# Patient Record
Sex: Female | Born: 1970 | Hispanic: No | Marital: Married | Smoking: Never smoker
Health system: Southern US, Community
[De-identification: ages and names within clinical notes are randomized; demographics above are authoritative.]

## PROBLEM LIST (undated history)

## (undated) DIAGNOSIS — I340 Nonrheumatic mitral (valve) insufficiency: Secondary | ICD-10-CM

## (undated) DIAGNOSIS — F419 Anxiety disorder, unspecified: Secondary | ICD-10-CM

## (undated) DIAGNOSIS — F329 Major depressive disorder, single episode, unspecified: Secondary | ICD-10-CM

## (undated) DIAGNOSIS — F32A Depression, unspecified: Secondary | ICD-10-CM

## (undated) DIAGNOSIS — I050 Rheumatic mitral stenosis: Secondary | ICD-10-CM

## (undated) DIAGNOSIS — N390 Urinary tract infection, site not specified: Secondary | ICD-10-CM

## (undated) HISTORY — DX: Major depressive disorder, single episode, unspecified: F32.9

## (undated) HISTORY — DX: Gilbert syndrome: E80.4

## (undated) HISTORY — DX: Depression, unspecified: F32.A

## (undated) HISTORY — PX: UTERINE FIBROID SURGERY: SHX826

## (undated) HISTORY — DX: Rheumatic mitral stenosis: I05.0

## (undated) HISTORY — PX: TONSILLECTOMY: SUR1361

## (undated) HISTORY — PX: APPENDECTOMY: SHX54

---

## 1997-12-02 ENCOUNTER — Emergency Department (HOSPITAL_COMMUNITY): Admission: EM | Admit: 1997-12-02 | Discharge: 1997-12-02 | Payer: Self-pay | Admitting: Emergency Medicine

## 1998-04-10 ENCOUNTER — Emergency Department (HOSPITAL_COMMUNITY): Admission: EM | Admit: 1998-04-10 | Discharge: 1998-04-10 | Payer: Self-pay | Admitting: Emergency Medicine

## 1998-06-04 ENCOUNTER — Emergency Department (HOSPITAL_COMMUNITY): Admission: EM | Admit: 1998-06-04 | Discharge: 1998-06-04 | Payer: Self-pay | Admitting: Emergency Medicine

## 1998-09-09 ENCOUNTER — Other Ambulatory Visit: Admission: RE | Admit: 1998-09-09 | Discharge: 1998-09-09 | Payer: Self-pay | Admitting: Obstetrics

## 2000-01-08 ENCOUNTER — Emergency Department (HOSPITAL_COMMUNITY): Admission: EM | Admit: 2000-01-08 | Discharge: 2000-01-08 | Payer: Self-pay | Admitting: Emergency Medicine

## 2000-07-04 ENCOUNTER — Other Ambulatory Visit: Admission: RE | Admit: 2000-07-04 | Discharge: 2000-07-04 | Payer: Self-pay | Admitting: Obstetrics

## 2000-09-02 ENCOUNTER — Inpatient Hospital Stay (HOSPITAL_COMMUNITY): Admission: AD | Admit: 2000-09-02 | Discharge: 2000-09-02 | Payer: Self-pay | Admitting: Obstetrics

## 2000-09-02 ENCOUNTER — Encounter: Payer: Self-pay | Admitting: Obstetrics

## 2000-09-23 ENCOUNTER — Inpatient Hospital Stay (HOSPITAL_COMMUNITY): Admission: AD | Admit: 2000-09-23 | Discharge: 2000-09-23 | Payer: Self-pay | Admitting: Obstetrics

## 2001-02-25 ENCOUNTER — Encounter: Payer: Self-pay | Admitting: Obstetrics

## 2001-02-25 ENCOUNTER — Ambulatory Visit (HOSPITAL_COMMUNITY): Admission: RE | Admit: 2001-02-25 | Discharge: 2001-02-25 | Payer: Self-pay | Admitting: Obstetrics

## 2001-02-26 ENCOUNTER — Inpatient Hospital Stay (HOSPITAL_COMMUNITY): Admission: AD | Admit: 2001-02-26 | Discharge: 2001-02-26 | Payer: Self-pay | Admitting: Obstetrics

## 2001-03-22 ENCOUNTER — Observation Stay (HOSPITAL_COMMUNITY): Admission: AD | Admit: 2001-03-22 | Discharge: 2001-03-23 | Payer: Self-pay | Admitting: Obstetrics

## 2001-03-28 ENCOUNTER — Inpatient Hospital Stay (HOSPITAL_COMMUNITY): Admission: AD | Admit: 2001-03-28 | Discharge: 2001-03-28 | Payer: Self-pay | Admitting: Obstetrics

## 2001-04-06 ENCOUNTER — Inpatient Hospital Stay (HOSPITAL_COMMUNITY): Admission: AD | Admit: 2001-04-06 | Discharge: 2001-04-06 | Payer: Self-pay | Admitting: Obstetrics

## 2001-04-11 ENCOUNTER — Encounter (INDEPENDENT_AMBULATORY_CARE_PROVIDER_SITE_OTHER): Payer: Self-pay | Admitting: *Deleted

## 2001-04-11 ENCOUNTER — Inpatient Hospital Stay (HOSPITAL_COMMUNITY): Admission: AD | Admit: 2001-04-11 | Discharge: 2001-04-14 | Payer: Self-pay | Admitting: Obstetrics

## 2002-05-05 ENCOUNTER — Encounter: Admission: RE | Admit: 2002-05-05 | Discharge: 2002-05-05 | Payer: Self-pay | Admitting: Internal Medicine

## 2002-08-11 ENCOUNTER — Inpatient Hospital Stay (HOSPITAL_COMMUNITY): Admission: AD | Admit: 2002-08-11 | Discharge: 2002-08-11 | Payer: Self-pay | Admitting: Obstetrics

## 2002-08-11 ENCOUNTER — Encounter: Payer: Self-pay | Admitting: Obstetrics and Gynecology

## 2003-02-05 ENCOUNTER — Inpatient Hospital Stay (HOSPITAL_COMMUNITY): Admission: RE | Admit: 2003-02-05 | Discharge: 2003-02-08 | Payer: Self-pay | Admitting: Obstetrics & Gynecology

## 2003-06-04 ENCOUNTER — Encounter: Admission: RE | Admit: 2003-06-04 | Discharge: 2003-06-04 | Payer: Self-pay | Admitting: Internal Medicine

## 2003-06-12 ENCOUNTER — Encounter: Admission: RE | Admit: 2003-06-12 | Discharge: 2003-06-12 | Payer: Self-pay | Admitting: Internal Medicine

## 2003-09-13 ENCOUNTER — Emergency Department (HOSPITAL_COMMUNITY): Admission: AD | Admit: 2003-09-13 | Discharge: 2003-09-13 | Payer: Self-pay | Admitting: Family Medicine

## 2003-09-15 ENCOUNTER — Emergency Department (HOSPITAL_COMMUNITY): Admission: EM | Admit: 2003-09-15 | Discharge: 2003-09-15 | Payer: Self-pay | Admitting: Emergency Medicine

## 2003-11-24 ENCOUNTER — Encounter: Admission: RE | Admit: 2003-11-24 | Discharge: 2003-11-24 | Payer: Self-pay | Admitting: Internal Medicine

## 2003-11-25 ENCOUNTER — Encounter: Admission: RE | Admit: 2003-11-25 | Discharge: 2003-11-25 | Payer: Self-pay | Admitting: Internal Medicine

## 2004-01-13 ENCOUNTER — Encounter: Admission: RE | Admit: 2004-01-13 | Discharge: 2004-01-13 | Payer: Self-pay | Admitting: Internal Medicine

## 2006-06-22 ENCOUNTER — Other Ambulatory Visit: Admission: RE | Admit: 2006-06-22 | Discharge: 2006-06-22 | Payer: Self-pay | Admitting: Internal Medicine

## 2006-07-05 ENCOUNTER — Ambulatory Visit: Payer: Self-pay | Admitting: Internal Medicine

## 2006-07-05 LAB — CONVERTED CEMR LAB
ALT: 17 units/L (ref 0–40)
AST: 18 units/L (ref 0–37)
Albumin: 4.2 g/dL (ref 3.5–5.2)
Alkaline Phosphatase: 43 units/L (ref 39–117)
Bilirubin, Direct: 0.4 mg/dL — ABNORMAL HIGH (ref 0.0–0.3)
Ceruloplasmin: 21 mg/dL (ref 21–63)
Total Bilirubin: 2.7 mg/dL — ABNORMAL HIGH (ref 0.3–1.2)
Total Protein: 6.6 g/dL (ref 6.0–8.3)

## 2006-07-18 ENCOUNTER — Encounter: Payer: Self-pay | Admitting: Internal Medicine

## 2006-07-18 LAB — CONVERTED CEMR LAB: LDH: 162 units/L (ref 94–250)

## 2008-07-01 ENCOUNTER — Telehealth (INDEPENDENT_AMBULATORY_CARE_PROVIDER_SITE_OTHER): Payer: Self-pay | Admitting: *Deleted

## 2008-07-12 ENCOUNTER — Emergency Department (HOSPITAL_COMMUNITY): Admission: EM | Admit: 2008-07-12 | Discharge: 2008-07-12 | Payer: Self-pay | Admitting: Emergency Medicine

## 2008-07-17 ENCOUNTER — Ambulatory Visit: Payer: Self-pay | Admitting: Internal Medicine

## 2009-01-19 ENCOUNTER — Encounter: Admission: RE | Admit: 2009-01-19 | Discharge: 2009-01-19 | Payer: Self-pay | Admitting: Internal Medicine

## 2009-01-19 ENCOUNTER — Ambulatory Visit: Payer: Self-pay | Admitting: Internal Medicine

## 2009-02-18 ENCOUNTER — Ambulatory Visit: Payer: Self-pay | Admitting: Internal Medicine

## 2009-10-25 ENCOUNTER — Emergency Department (HOSPITAL_COMMUNITY): Admission: EM | Admit: 2009-10-25 | Discharge: 2009-10-25 | Payer: Self-pay | Admitting: Family Medicine

## 2010-01-27 ENCOUNTER — Ambulatory Visit: Payer: Self-pay | Admitting: Internal Medicine

## 2010-02-25 ENCOUNTER — Ambulatory Visit: Payer: Self-pay | Admitting: Internal Medicine

## 2010-02-25 ENCOUNTER — Other Ambulatory Visit: Admission: RE | Admit: 2010-02-25 | Discharge: 2010-02-25 | Payer: Self-pay | Admitting: Internal Medicine

## 2010-04-03 ENCOUNTER — Emergency Department (HOSPITAL_COMMUNITY)
Admission: EM | Admit: 2010-04-03 | Discharge: 2010-04-03 | Payer: Self-pay | Source: Home / Self Care | Admitting: Emergency Medicine

## 2010-06-28 NOTE — Progress Notes (Signed)
  Phone Note Call from Patient   Summary of Call: Patient presented complaining mid upper back pain with pain in left shoulder and elbow.  She denies weakness, numbness or radicular pain.  Pain started about 10 days ago.  It is intermittent, burning, made worse by certain movements.  No trauma, but she has a young child she carries in that arm and lays on it.  Will give trial of Flexeril, heating pad, and Ibuprofen.  If no improvement in one to two weeks, she will need formal evaluation.  Pt was counseled on side effects of Flexeril.  Initial call taken by: Manning Charity MD,  July 01, 2008 4:47 PM    New/Updated Medications: FLEXERIL 5 MG TABS (CYCLOBENZAPRINE HCL) Take 1 tablet by mouth every eight hours as needed for back pain.   Prescriptions: FLEXERIL 5 MG TABS (CYCLOBENZAPRINE HCL) Take 1 tablet by mouth every eight hours as needed for back pain.  #21 x 1   Entered and Authorized by:   Manning Charity MD   Signed by:   Manning Charity MD on 07/01/2008   Method used:   Print then Give to Patient   RxID:   [id number]

## 2010-08-15 LAB — WOUND CULTURE: Culture: NO GROWTH

## 2010-10-14 NOTE — H&P (Signed)
[facility]  Patient:    Marissa Lewis, Marissa Lewis Visit Number: [id number] MRN: [id number]          Service Type: OBS Location: 910A 9147 01 Attending Physician:  Venita Sheffield Dictated by:   Kathreen Cosier, M.D. Admit Date:  04/11/2001                           History and Physical  NO DICTATION (ALREADY ON CHART) Dictated by:   Kathreen Cosier, M.D. Attending Physician:  Venita Sheffield DD:  04/11/01 TD:  04/11/01 Job: [id number]

## 2010-10-14 NOTE — Assessment & Plan Note (Signed)
[facility]                         GASTROENTEROLOGY OFFICE NOTE   NAME:SCOTTLataria, Courser                      MRN:          [id number]  DATE:07/05/2006                            DOB:          Sep 10, 1970    CHIEF COMPLAINT:  Abnormal liver tests.   ASSESSMENT:  A 40 year old African American woman I was asked to see by  Dr. Lenord Fellers regarding elevated bilirubin. I had seen her back in 2005,  with a cholestatic jaundice problem with abnormal transaminases,  bilirubin and alkaline phosphatase. Subsequently, she has had repeat  LFTs, which showed elevated bilirubin mostly which is indirect. 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 Her transaminases and alkaline phosphatase and GGT  are normal now.   RECOMMENDATIONS AND PLAN:  1. Check ceruloplasmin and repeat a hepatic function panel one more      time, check an LDH. She is not anemic, so I do not think this is      hemolysis. That is in the differential, but seems unlikely overall.  2. Stop supplementing with herbal weight gain supplement and Megace      (she got a Megace prescription from someone else). She has normal      body weight and body mass index. She has tried to gain weight for      some reason, but I have recommended to her that she does not need      to do that for any health purposes. She can stop her iron and take      a multivitamin if she wants.  3. Further plans pending on the above. I will call with results.   HISTORY:  A  40 year old [place] American woman with history as above.  Recent laboratory testing from Dr. Lenord Fellers has shown normal CBC, normal  BMET. Her lipid profile is normal. She had a bilirubin total of 3.7 with  direct 0.6, indirect 3.1; normal alk-phos, AST/ALT in 2007. The other  labs were from 2008. She also had LFTs last month, January 2008, with  bilirubin of 3.4, direct 0.5, indirect 2.9.  Her earlier LFTs  were  normal as was her TSH.   She has no symptoms. She does not notice any jaundice, perhaps some  fatigue at times. There is no family history of liver disease. She does  not drink alcohol.   PAST MEDICAL HISTORY:  1. Cholestatic jaundice thought secondary to birth control pills in      the past. That seems to have resolved.  2. Appendectomy.  3. Cesarean section x1. Natural childbirth x1.  4. Lysis of adhesions with her appendectomy.   CURRENT MEDICATIONS:  1. Iron.  2. Multivitamin.   DRUG ALLERGIES:  PENICILLIN.   SOCIAL HISTORY:  She is a patient Development worker, international aid at [employer], two sons and lives with her husband and children. No alcohol,  tobacco or drugs.   REVIEW OF SYSTEMS:  Otherwise, negative.   PHYSICAL EXAMINATION:  Reveals a well-developed, well-nourished young  black woman. Height is 5 feet, 8 inches; 139 pounds. Blood pressure  110/56. Pulse 60.  EYES: Anicteric, though the sclerae might be slightly muddy.  NECK: Is supple without mass, thyromegaly. No lymphadenopathy in the  neck or supraclavicular nodes.  CHEST: Is clear.  HEART: S1, S2. No murmurs, rubs or gallop.  ABDOMEN: Is soft. Nontender. There is no organomegaly or mass  specifically. No hepatosplenomegaly.  EXTREMITIES: No edema.  She is alert and oriented x3.   I appreciate the opportunity to care for this patient.     Iva Boop, MD,FACG  Electronically Signed    CEG/MedQ  DD: 07/05/2006  DT: 07/05/2006  Job #: [id number]   cc:   Luanna Cole. Lenord Fellers, M.D.

## 2010-10-14 NOTE — Discharge Summary (Signed)
   NAME:  Marissa Lewis, Marissa Lewis                         ACCOUNT NO.:  [id number]   MEDICAL RECORD NO.:  [id number]                   PATIENT TYPE:  INP   LOCATION:  9129                                 FACILITY:  [facility]   PHYSICIAN:  Charles A. Clearance Coots, M.D.             DATE OF BIRTH:  September 23, 1970   DATE OF ADMISSION:  02/05/2003  DATE OF DISCHARGE:  02/08/2003                                 DISCHARGE SUMMARY   ADMITTING DIAGNOSES:  Symptomatic uterine fibroids.   DISCHARGE DIAGNOSES:  Symptomatic uterine fibroids status post abdominal  myomectomy.   DISPOSITION:  Discharged home in good condition.   REASON FOR ADMISSION:  A 40 year old African-American female with  symptomatic uterine fibroids presents for abdominal myomectomy.   PAST SURGICAL HISTORY:  1. Breast biopsy.  2. Cesarean section.   PAST MEDICAL HISTORY:  None.   MEDICATIONS:  Iron.   ALLERGIES:  PENICILLIN.   FAMILY HISTORY:  Positive for diabetes.   PHYSICAL EXAMINATION:  GENERAL:  Well-nourished, well-developed black female  in no acute distress.  VITAL SIGNS:  Temperature 98.9, pulse 72, blood pressure 100/70.  HEENT:  Normal.  LUNGS:  Clear to auscultation bilaterally.  HEART:  Regular rate and rhythm.  ABDOMEN:  Tender mass in the left lower quadrant.  EXTREMITIES:  Without clubbing, cyanosis, edema.  PELVIC:  Uterus mildly enlarged, tender.   ADMITTING LABORATORY VALUES:  Hemoglobin 11.6, hematocrit 34.0, white blood  cell count 7300, platelets 329,000.   HOSPITAL COURSE:  The patient underwent an abdominal myomectomy on February 05, 2003.  There were no intraoperative complications.  Postoperative course  was uncomplicated and she was discharged home on postoperative day #38 in  good condition.   DISCHARGE LABORATORIES:  Hemoglobin 7, hematocrit 19.8, white blood cell  count 13,500, platelets 268,000.    DISCHARGE DISPOSITION:   DISCHARGE MEDICATIONS:  1. Tylox one to two tablets q.3-4h. as needed  for pain.  2. Iron one tablet p.o. daily.   DISCHARGE INSTRUCTIONS:  Routine written instructions were given for  abdominal myomectomy discharge.  The patient is to follow up in the office  in four weeks with Roseanna Rainbow, M.D.                                               Charles A. Clearance Coots, M.D.    CAH/MEDQ  D:  04/02/2003  T:  04/03/2003  Job:  [id number]

## 2010-10-14 NOTE — Discharge Summary (Signed)
[facility]  Patient:    KENDELLE, SCHWEERS Visit Number: [id number] MRN: [id number]          Service Type: OBS Location: 910A 9147 01 Attending Physician:  Venita Sheffield Dictated by:   Kathreen Cosier, M.D. Admit Date:  04/11/2001 Discharge Date: 04/14/2001                             Discharge Summary  HOSPITAL COURSE:              The patient is a 40 year old female who was brought in for elective cesarean section because of microsomia by ultrasound and an exam. She was known to have large 8 x 8 cm left lower segment myoma which displaced the vertex. On exam, the vertex was floating. At the time of cesarean section, it was noted she had an 8-10 cm left lower segment myoma with large vessels feeding the same and also large fundal myoma. She had an 8 pound 2 ounce female APGAR ______ with a blood loss of 1200 cc postop. Her hemoglobin was 70. She was asymptomatic. She was given ferrous sulfate 325 twice a day. On exam post delivery, she appeared to have about a 15 cm right fundal myoma also present. She was discharged home on the third postoperative day and returned to a regular diet on Tylox one p.o. 4 p.r.n., ferrous sulfate 325 b.i.d. and Micronor one daily.  DISCHARGE DIAGNOSES:          Status post elective cesarean section at term. Dictated by:   Kathreen Cosier, M.D. Attending Physician:  Venita Sheffield DD:  04/14/01 TD:  04/14/01 Job: [id number]

## 2010-10-14 NOTE — Op Note (Signed)
NAME:  Marissa Lewis, Marissa Lewis                         ACCOUNT NO.:  [id number]   MEDICAL RECORD NO.:  [id number]                   PATIENT TYPE:  INP   LOCATION:  9129                                 [facility]ILITY:  [facility]   PHYSICIAN:  Roseanna Rainbow, M.D.         DATE OF BIRTH:  [birth year]   DATE OF PROCEDURE:  02/05/2003  DATE OF DISCHARGE:                                 OPERATIVE REPORT   PREOPERATIVE DIAGNOSES:  1. Pelvic pain.  2. Uterine fibroids.   POSTOPERATIVE DIAGNOSIS:  Pelvic adhesions.   PROCEDURES:  1. Exploratory laparotomy.  2. Lysis of adhesions.   SURGEON:  Roseanna Rainbow, M.D.   ANESTHESIA:  General endotracheal.   COMPLICATIONS:  None.   ESTIMATED BLOOD LOSS:  Less than 100 mL.   IV FLUID/URINE OUTPUT:  As per anesthesiology.   FINDINGS:  There were dense adhesions involving the left lateral aspect of  the uterus as well as the left adnexa to the pelvic side wall.  The  adhesions obliterated the broad ligament on the left side.  There also were  filmy large bowel adhesions to the left adnexa as well.  No uterine fibroids  were visualized.   DESCRIPTION OF PROCEDURE:  The patient was taken to the operating room with  an IV running.  General anesthesia was administered without difficulty.  She  was prepped and draped in the usual sterile fashion in the dorsal lithotomy  position.  The previous Pfannenstiel incision scar was then incised with a  scalpel.  This incision was carried down through the underlying fascia.  The  fascia was incised along the length of the incision with the Bovie.  The  superior aspect of the fascial incision was then grasped with Kocher clamps,  elevated, and the underlying rectus muscles dissected off.  The inferior  aspect of the vaginal incision was then manipulated in a similar fashion.  The rectus muscles were separated in the midline.  The parietal peritoneum  was tented up and entered sharply.  This incision was then  extended  superiorly and inferiorly with good visualization of the bladder.  At this  point an O'Connor-O'Sullivan retractor was placed into the incision.  The  bowel was packed away with moistened laparotomy packs.  The above findings  were noted.  At this point a window was made in the retroperitoneal space  along the left pelvic side wall.  There were no palpable interligamentous  uterine fibroids.  At this point no further dissection or lysis of adhesions  was performed sharply.  Filmy bowel adhesions to the left ovary and tube  were lysed bluntly.  The laparotomy pads were then removed as well as the  O'Connor-O'Sullivan retractor.  The pelvis had been copiously irrigated and  the peritoneal incision sites were found to be hemostatic.  The parietal  peritoneum was  reapproximated with 2-0 Monocryl in running fashion.  The fascia was  reapproximated with  0 PDS.  The skin was reapproximated with staples.  At  the close of the procedure the instrument and pack counts were said to be  correct x2.  The patient was taken to the PACU awake and in stable  condition.                                               Roseanna Rainbow, M.D.    Judee Clara  D:  02/05/2003  T:  02/06/2003  Job:  [id number]

## 2010-10-14 NOTE — Op Note (Signed)
[facility]  Patient:    KATRESE, SHELL Visit Number: [id number] MRN: [id number]          Service Type: OBS Location: 910A 9147 01 Attending Physician:  Venita Sheffield Dictated by:   Kathreen Cosier, M.D. Proc. Date: 04/11/01 Admit Date:  04/11/2001                             Operative Report  PREOPERATIVE DIAGNOSES:       1. Intrauterine pregnancy at term.                               2. Myoma uteri.                               3. Macrosomia by ultrasound.                               4. The patient desires a cesarean section.  ANESTHESIA:                   Spinal.  SURGEON:                      Kathreen Cosier, M.D.  DESCRIPTION OF PROCEDURE:     The patient was placed on the operating room table in the supine position. After the spinal anesthesia was administered by Dr. Shela Commons. Leilani Able, Montez Hageman., the abdomen was prepped and draped and the bladder emptied with a Foley catheter.  A transverse suprapubic incision was made and carried down through the rectus fascia.  The fascia was cleaned and incised the length of the incision.  The recti muscles were retracted laterally.  The peritoneum was incised longitudinally.  It was noted that in the left lower segment of the uterus there was about an 8.0 cm x 8.0 cm myoma, and there were some massive blood vessels feeding into this myoma.  Using Metzenbaum scissors the viscera peritoneum above the bladder was dissected and the bladder pushed down.  A transverse lower uterine incision was made. The patient delivered with assistance with the vacuum of a female with Apgar of 9, and weighing 8 pounds 2 ounces from the LOA position.  The placenta was anterior and removed right away.  The uterine cavity was cleaned with dry laps.  The uterine incision was closed in one layer with continuous suture of #1 chromic.  Hemostasis was satisfactory.  The bladder flap was reattached with #2-0  chromic.  The uterus was retracted.  On the right side of the uterus in the fundal area there was about a 15.0 cm intramural myoma also present. The lap and sponge counts were correct.  The abdomen was closed in layers, the peritoneum with continuous suture of #0 chromic, the fascia with continuous sutures of #0 Dexon, and the skin closed with subcuticular sutures of #3-0 plain.  The blood loss was a total of 1400 cc. Dictated by:   Kathreen Cosier, M.D. Attending Physician:  Venita Sheffield DD:  04/11/01 TD:  04/11/01 Job: [id number]

## 2012-06-17 ENCOUNTER — Emergency Department (HOSPITAL_COMMUNITY)
Admission: EM | Admit: 2012-06-17 | Discharge: 2012-06-17 | Disposition: A | Discharge status: Home / Self Care | Payer: Self-pay | Attending: Emergency Medicine | Admitting: Emergency Medicine

## 2012-06-17 ENCOUNTER — Encounter (HOSPITAL_COMMUNITY): Payer: Self-pay | Admitting: Family Medicine

## 2012-06-17 DIAGNOSIS — L250 Unspecified contact dermatitis due to cosmetics: Secondary | ICD-10-CM | POA: Insufficient documentation

## 2012-06-17 DIAGNOSIS — T7840XA Allergy, unspecified, initial encounter: Secondary | ICD-10-CM

## 2012-06-17 DIAGNOSIS — Z79899 Other long term (current) drug therapy: Secondary | ICD-10-CM | POA: Insufficient documentation

## 2012-06-17 MED ORDER — DIPHENHYDRAMINE HCL 25 MG PO TABS: 50.0000 mg | ORAL_TABLET | ORAL | Status: DC | PRN

## 2012-06-17 MED ORDER — FAMOTIDINE 20 MG PO TABS: 20.0000 mg | ORAL_TABLET | Freq: Two times a day (BID) | ORAL | Status: DC

## 2012-06-17 MED ORDER — PREDNISONE 20 MG PO TABS: ORAL_TABLET | ORAL | Status: DC

## 2012-06-17 MED ORDER — LORATADINE 10 MG PO TABS: 10.0000 mg | ORAL_TABLET | Freq: Every day | ORAL | Status: DC

## 2012-06-17 NOTE — ED Notes (Signed)
Per pt sts she had her eyelashes done the other day and since has had significant eye swelling, pain and light sensitivity. sts that she thinks there was coconut oil in them and she is allergic

## 2012-06-17 NOTE — ED Notes (Signed)
Pt states she had fake eye lashes put on a couple of days ago. States she's allergic to coconut oil.

## 2012-06-17 NOTE — ED Provider Notes (Signed)
History     CSN: [id number]  Arrival date & time 06/17/12  1336   First MD Initiated Contact with Patient 06/17/12 1516      Chief Complaint  Patient presents with  . Facial Swelling    (Consider location/radiation/quality/duration/timing/severity/associated sxs/prior treatment) The history is provided by the patient.   This 42 year old female has 3 days of itching to her bilateral eyelids. Pt had onset of symptoms Saturday when she had false eyelashes applied. She felt like at the time that it was "different" than other times that she had had them done and that she felt a tingling sensation. She immediately started to itch after leaving the salon and had increasing symptoms since Saturday.  Her eyelids began to swell Saturday night, with copious tearing, minimal erythema and with minimal if any pain.  She now complains of a gradual onset mild headache along with her eye pain and itching.  She took the eyelashes off this morning with baby oil and said that she thinks there is a possibility of coconut oil having been used to apply the lashes, and that she is allergic to coconut. There is no visual change or foreign body sensation in her eyes. Treatment PTA was warm compresses. She is no fever, rash, shortness of breath, lip swelling or tongue swelling. History reviewed. No pertinent past medical history.  Past Surgical History  Procedure Date  . Uterine fibroid surgery     History reviewed. No pertinent family history.  History  Substance Use Topics  . Smoking status: Never Smoker   . Smokeless tobacco: Not on file  . Alcohol Use: No    OB History    Grav Para Term Preterm Abortions TAB SAB Ect Mult Living                  Review of Systems 10 Systems reviewed and are negative for acute change except as noted in the HPI. Allergies  Penicillins and Sulfa antibiotics  Home Medications   Current Outpatient Rx  Name  Route  Sig  Dispense  Refill  . DIPHENHYDRAMINE HCL 25  MG PO TABS   Oral   Take 2 tablets (50 mg total) by mouth every 4 (four) hours as needed for itching.   20 tablet   0   . FAMOTIDINE 20 MG PO TABS   Oral   Take 1 tablet (20 mg total) by mouth 2 (two) times daily.   10 tablet   0   . LORATADINE 10 MG PO TABS   Oral   Take 1 tablet (10 mg total) by mouth daily. One po daily x 5 days   5 tablet   0   . PREDNISONE 20 MG PO TABS      3 tabs po daily x 3 days   9 tablet   0     BP 104/69  Pulse 64  Temp 99 F (37.2 C)  Resp 20  SpO2 100%  LMP 05/18/2012  Physical Exam  Nursing note and vitals reviewed. Constitutional:       Awake, alert, nontoxic appearance.  HENT:  Head: Normocephalic and atraumatic.  Right Ear: External ear normal.  Left Ear: External ear normal.  Mouth/Throat: Oropharynx is clear and moist.  Eyes: EOM are normal. Pupils are equal, round, and reactive to light. Right eye exhibits no discharge. Left eye exhibits no discharge. Right conjunctiva is injected. Left conjunctiva is injected. No scleral icterus. Right eye exhibits normal extraocular motion. Left eye exhibits normal  extraocular motion.       Conjunctiva are mildly injected bilaterally with tearing but no discharge.  Upper lid swelling and minimal erythema noted on both eyelids.  No vision changes. Pt complains of eyes itching.  No induration or tenderness to eyelids, only mild edema.  Neck: Normal range of motion. Neck supple.  Cardiovascular: Normal rate, regular rhythm and normal heart sounds.   No murmur heard. Pulmonary/Chest: Effort normal and breath sounds normal. No stridor. No respiratory distress. She has no wheezes. She has no rales. She exhibits no tenderness.  Abdominal: Soft. Bowel sounds are normal. She exhibits no distension and no mass. There is no tenderness. There is no rebound and no guarding.  Musculoskeletal: She exhibits no edema and no tenderness.       Patient moves all 4 extremities well with no obvious apparent focal  weakness.  Neurological: She is alert.       Mental status and motor strength appears baseline for patient and situation.  Skin: Skin is warm and dry. No rash noted. She is not diaphoretic.       Erythema around both eyes.  Psychiatric: She has a normal mood and affect. Her behavior is normal.    ED Course  Procedures (including critical care time)  Labs Reviewed - No data to display No results found.   1. Allergic reaction       MDM  Pt stable in ED with no significant deterioration in condition. Patient / Family / Caregiver informed of clinical course, understand medical decision-making process, and agree with plan.  I doubt any other EMC precluding discharge at this time including, but not necessarily limited to the following:cellulitis, anaphylaxis.          Hurman Horn, MD 06/24/12 [phone]

## 2012-06-17 NOTE — ED Notes (Signed)
MD at bedside. Dr. Bednar. 

## 2012-07-25 ENCOUNTER — Ambulatory Visit: Payer: Self-pay | Admitting: Internal Medicine

## 2012-09-09 ENCOUNTER — Other Ambulatory Visit: Payer: Self-pay | Admitting: Internal Medicine

## 2012-09-09 DIAGNOSIS — Z0000 Encounter for general adult medical examination without abnormal findings: Secondary | ICD-10-CM

## 2012-09-09 LAB — CBC WITH DIFFERENTIAL/PLATELET
Basophils Absolute: 0 10*3/uL (ref 0.0–0.1)
Basophils Relative: 0 % (ref 0–1)
Eosinophils Absolute: 0.1 10*3/uL (ref 0.0–0.7)
Eosinophils Relative: 1 % (ref 0–5)
HCT: 37.2 % (ref 36.0–46.0)
Hemoglobin: 12.7 g/dL (ref 12.0–15.0)
Lymphocytes Relative: 37 % (ref 12–46)
Lymphs Abs: 2.2 10*3/uL (ref 0.7–4.0)
MCH: 30.5 pg (ref 26.0–34.0)
MCHC: 34.1 g/dL (ref 30.0–36.0)
MCV: 89.4 fL (ref 78.0–100.0)
Monocytes Absolute: 0.4 10*3/uL (ref 0.1–1.0)
Monocytes Relative: 7 % (ref 3–12)
Neutro Abs: 3.3 10*3/uL (ref 1.7–7.7)
Neutrophils Relative %: 55 % (ref 43–77)
Platelets: 260 10*3/uL (ref 150–400)
RBC: 4.16 MIL/uL (ref 3.87–5.11)
RDW: 13.5 % (ref 11.5–15.5)
WBC: 6 10*3/uL (ref 4.0–10.5)

## 2012-09-09 LAB — COMPREHENSIVE METABOLIC PANEL
ALT: 8 U/L (ref 0–35)
AST: 12 U/L (ref 0–37)
Albumin: 4.3 g/dL (ref 3.5–5.2)
Alkaline Phosphatase: 32 U/L — ABNORMAL LOW (ref 39–117)
BUN: 14 mg/dL (ref 6–23)
CO2: 25 mEq/L (ref 19–32)
Calcium: 9.1 mg/dL (ref 8.4–10.5)
Chloride: 104 mEq/L (ref 96–112)
Creat: 0.83 mg/dL (ref 0.50–1.10)
Glucose, Bld: 84 mg/dL (ref 70–99)
Potassium: 3.8 mEq/L (ref 3.5–5.3)
Sodium: 139 mEq/L (ref 135–145)
Total Bilirubin: 2.2 mg/dL — ABNORMAL HIGH (ref 0.3–1.2)
Total Protein: 6.4 g/dL (ref 6.0–8.3)

## 2012-09-09 LAB — LIPID PANEL
Cholesterol: 178 mg/dL (ref 0–200)
HDL: 70 mg/dL (ref >39)
LDL Cholesterol: 99 mg/dL (ref 0–99)
Total CHOL/HDL Ratio: 2.5 Ratio
Triglycerides: 46 mg/dL (ref <150)
VLDL: 9 mg/dL (ref 0–40)

## 2012-09-09 LAB — TSH: TSH: 0.61 u[IU]/mL (ref 0.350–4.500)

## 2012-09-10 ENCOUNTER — Other Ambulatory Visit (HOSPITAL_COMMUNITY)
Admission: RE | Admit: 2012-09-10 | Discharge: 2012-09-10 | Disposition: A | Discharge status: Home / Self Care | Payer: Self-pay | Source: Ambulatory Visit | Attending: Internal Medicine | Admitting: Internal Medicine

## 2012-09-10 ENCOUNTER — Encounter: Payer: Self-pay | Admitting: Internal Medicine

## 2012-09-10 ENCOUNTER — Ambulatory Visit (INDEPENDENT_AMBULATORY_CARE_PROVIDER_SITE_OTHER): Payer: BC Managed Care – PPO | Admitting: Internal Medicine

## 2012-09-10 VITALS — BP 100/72 | HR 68 | Ht 67.5 in | Wt 137.0 lb

## 2012-09-10 DIAGNOSIS — Z01419 Encounter for gynecological examination (general) (routine) without abnormal findings: Secondary | ICD-10-CM | POA: Insufficient documentation

## 2012-09-10 DIAGNOSIS — H6120 Impacted cerumen, unspecified ear: Secondary | ICD-10-CM

## 2012-09-10 DIAGNOSIS — N898 Other specified noninflammatory disorders of vagina: Secondary | ICD-10-CM

## 2012-09-10 DIAGNOSIS — Z0000 Encounter for general adult medical examination without abnormal findings: Secondary | ICD-10-CM

## 2012-09-10 DIAGNOSIS — R109 Unspecified abdominal pain: Secondary | ICD-10-CM | POA: Insufficient documentation

## 2012-09-10 DIAGNOSIS — Z23 Encounter for immunization: Secondary | ICD-10-CM

## 2012-09-10 DIAGNOSIS — R319 Hematuria, unspecified: Secondary | ICD-10-CM

## 2012-09-10 DIAGNOSIS — A499 Bacterial infection, unspecified: Secondary | ICD-10-CM

## 2012-09-10 DIAGNOSIS — B9689 Other specified bacterial agents as the cause of diseases classified elsewhere: Secondary | ICD-10-CM | POA: Insufficient documentation

## 2012-09-10 DIAGNOSIS — N760 Acute vaginitis: Secondary | ICD-10-CM

## 2012-09-10 DIAGNOSIS — H6123 Impacted cerumen, bilateral: Secondary | ICD-10-CM | POA: Insufficient documentation

## 2012-09-10 LAB — POCT WET PREP (WET MOUNT)

## 2012-09-10 LAB — POCT URINALYSIS DIPSTICK
Bilirubin, UA: NEGATIVE
Glucose, UA: NEGATIVE
Ketones, UA: NEGATIVE
Leukocytes, UA: NEGATIVE
Nitrite, UA: NEGATIVE
Protein, UA: NEGATIVE
Spec Grav, UA: 1.025
Urobilinogen, UA: NEGATIVE
pH, UA: 7

## 2012-09-10 LAB — URINALYSIS, ROUTINE W REFLEX MICROSCOPIC
Bilirubin Urine: NEGATIVE
Glucose, UA: NEGATIVE mg/dL
Ketones, ur: NEGATIVE mg/dL
Leukocytes, UA: NEGATIVE
Nitrite: NEGATIVE
Protein, ur: NEGATIVE mg/dL
Specific Gravity, Urine: 1.024 (ref 1.005–1.030)
Urobilinogen, UA: 1 mg/dL (ref 0.0–1.0)
pH: 7 (ref 5.0–8.0)

## 2012-09-10 LAB — VITAMIN D 25 HYDROXY (VIT D DEFICIENCY, FRACTURES): Vit D, 25-Hydroxy: <10 ng/mL — ABNORMAL LOW (ref 30–89)

## 2012-09-10 NOTE — Patient Instructions (Addendum)
Keep appt with GYN. Use Cleocin vaginal cream at bedtime for 7 days followed by vinegar and water douche.

## 2012-09-11 LAB — URINALYSIS, MICROSCOPIC ONLY
Casts: NONE SEEN
Crystals: NONE SEEN

## 2012-09-12 LAB — URINE CULTURE: Colony Count: 70000

## 2012-09-18 ENCOUNTER — Ambulatory Visit: Payer: BC Managed Care – PPO | Admitting: Obstetrics & Gynecology

## 2012-12-12 ENCOUNTER — Ambulatory Visit: Payer: BC Managed Care – PPO | Admitting: Obstetrics & Gynecology

## 2013-01-01 ENCOUNTER — Ambulatory Visit: Payer: BC Managed Care – PPO | Admitting: Obstetrics & Gynecology

## 2013-01-13 ENCOUNTER — Ambulatory Visit (INDEPENDENT_AMBULATORY_CARE_PROVIDER_SITE_OTHER): Payer: BC Managed Care – PPO | Admitting: Internal Medicine

## 2013-01-13 ENCOUNTER — Encounter: Payer: Self-pay | Admitting: Internal Medicine

## 2013-01-13 VITALS — BP 116/72 | HR 76 | Wt 135.5 lb

## 2013-01-13 DIAGNOSIS — F341 Dysthymic disorder: Secondary | ICD-10-CM

## 2013-01-13 DIAGNOSIS — R630 Anorexia: Secondary | ICD-10-CM

## 2013-01-13 DIAGNOSIS — F419 Anxiety disorder, unspecified: Secondary | ICD-10-CM

## 2013-01-13 DIAGNOSIS — F32A Depression, unspecified: Secondary | ICD-10-CM

## 2013-01-13 DIAGNOSIS — F5000 Anorexia nervosa, unspecified: Secondary | ICD-10-CM

## 2013-01-13 DIAGNOSIS — F4321 Adjustment disorder with depressed mood: Secondary | ICD-10-CM

## 2013-01-13 DIAGNOSIS — F329 Major depressive disorder, single episode, unspecified: Secondary | ICD-10-CM

## 2013-01-13 MED ORDER — FLUOXETINE HCL 10 MG PO CAPS: 10.0000 mg | ORAL_CAPSULE | Freq: Every day | ORAL | Status: DC

## 2013-01-13 NOTE — Patient Instructions (Addendum)
Start Prozac 10 mg daily and return in 6 weeks.
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
## 2013-01-14 LAB — PREALBUMIN: Prealbumin: 19.3 mg/dL (ref 17.0–34.0)

## 2013-01-22 ENCOUNTER — Ambulatory Visit: Payer: BC Managed Care – PPO | Admitting: Obstetrics & Gynecology

## 2013-01-29 ENCOUNTER — Encounter: Payer: Self-pay | Admitting: Obstetrics & Gynecology

## 2013-01-29 ENCOUNTER — Ambulatory Visit (INDEPENDENT_AMBULATORY_CARE_PROVIDER_SITE_OTHER): Payer: BC Managed Care – PPO | Admitting: Obstetrics & Gynecology

## 2013-01-29 VITALS — BP 130/71 | HR 59 | Temp 98.3°F | Ht 68.0 in | Wt 133.6 lb

## 2013-01-29 DIAGNOSIS — Z3202 Encounter for pregnancy test, result negative: Secondary | ICD-10-CM

## 2013-01-29 DIAGNOSIS — D259 Leiomyoma of uterus, unspecified: Secondary | ICD-10-CM

## 2013-01-29 DIAGNOSIS — N939 Abnormal uterine and vaginal bleeding, unspecified: Secondary | ICD-10-CM

## 2013-01-29 DIAGNOSIS — N926 Irregular menstruation, unspecified: Secondary | ICD-10-CM

## 2013-01-29 LAB — POCT URINE PREGNANCY: Preg Test, Ur: NEGATIVE

## 2013-01-29 NOTE — Progress Notes (Signed)
.   Subjective:     Marissa Lewis is a 43 y.o. female here for a problem exam.  Current complaints:  She thinks she may have fibroids.  She has painful heavy cycles every month.  Her last ultrasound was a couple of years ago.  Personal health questionnaire reviewed: no.   Gynecologic History Patient's last menstrual period was 01/06/2013. Contraception: none Last Pap: 2014. Results were: normal Last mammogram: N/A Obstetric History OB History  No data available     The following portions of the patient's history were reviewed and updated as appropriate: allergies, current medications, past family history, past medical history, past social history, past surgical history and problem list.  Review of Systems Pertinent items are noted in HPI.    Objective:  Abdomen: There is a mass arising from the pelvis in the midline to approximately 4 fingerbreadths below the umbilicus. This was irregular and mobile. The mass was nontender.  Assessment:   AUB--L ?Pain related to myomas and adhesions; DDx includes possible endometriosis  Plan:   Pelvic U/S Considering definitive surgery--pt education materials given re: hysterectomy Return after the U/S

## 2013-01-29 NOTE — Patient Instructions (Signed)
Pelvic Pain, Female Female pelvic pain can be caused by many different things and start from a variety of places. Pelvic pain refers to pain that is located in the lower half of the abdomen and between your hips. The pain may occur over a short period of time (acute) or may be reoccurring (chronic). The cause of pelvic pain may be related to disorders affecting the female reproductive organs (gynecologic), but it may also be related to the bladder, kidney stones, an intestinal complication, or muscle or skeletal problems. Getting help right away for pelvic pain is important, especially if there has been severe, sharp, or a sudden onset of unusual pain. It is also important to get help right away because some types of pelvic pain can be life threatening.  CAUSES  Below are only some of the causes of pelvic pain. The causes of pelvic pain can be in one of several categories.   Gynecologic.  Pelvic inflammatory disease.  Sexually transmitted infection.  Ovarian cyst or a twisted ovarian ligament (ovarian torsion).  Uterine lining that grows outside the uterus (endometriosis).  Fibroids, cysts, or tumors.  Ovulation.  Pregnancy.  Pregnancy that occurs outside the uterus (ectopic pregnancy).  Miscarriage.  Labor.  Abruption of the placenta or ruptured uterus.  Infection.  Uterine infection (endometritis).  Bladder infection.  Diverticulitis.  Miscarriage related to a uterine infection (septic abortion).  Bladder.  Inflammation of the bladder (cystitis).  Kidney stone(s).  Gastrointenstinal.  Constipation.  Diverticulitis.  Neurologic.  Trauma.  Feeling pelvic pain because of mental or emotional causes (psychosomatic).  Cancers of the bowel or pelvis. EVALUATION  Your caregiver will want to take a careful history of your concerns. This includes recent changes in your health, a careful gynecologic history of your periods (menses), and a sexual history. Obtaining  your family history and medical history is also important. Your caregiver may suggest a pelvic exam. A pelvic exam will help identify the location and severity of the pain. It also helps in the evaluation of which organ system may be involved. In order to identify the cause of the pelvic pain and be properly treated, your caregiver may order tests. These tests may include:   A pregnancy test.  Pelvic ultrasonography.  An X-ray exam of the abdomen.  A urinalysis or evaluation of vaginal discharge.  Blood tests. HOME CARE INSTRUCTIONS   Only take over-the-counter or prescription medicines for pain, discomfort, or fever as directed by your caregiver.   Rest as directed by your caregiver.   Eat a balanced diet.   Drink enough fluids to make your urine clear or pale yellow, or as directed.   Avoid sexual intercourse if it causes pain.   Apply warm or cold compresses to the lower abdomen depending on which one helps the pain.   Avoid stressful situations.   Keep a journal of your pelvic pain. Write down when it started, where the pain is located, and if there are things that seem to be associated with the pain, such as food or your menstrual cycle.  Follow up with your caregiver as directed.  SEEK MEDICAL CARE IF:  Your medicine does not help your pain.  You have abnormal vaginal discharge. SEEK IMMEDIATE MEDICAL CARE IF:   You have heavy bleeding from the vagina.   Your pelvic pain increases.   You feel lightheaded or faint.   You have chills.   You have pain with urination or blood in your urine.   You have uncontrolled  diarrhea or vomiting.   You have a fever or persistent symptoms for more than 3 days.  You have a fever and your symptoms suddenly get worse.   You are being physically or sexually abused.  MAKE SURE YOU:  Understand these instructions.  Will watch your condition.  Will get help if you are not doing well or get worse. Document  Released: 04/11/2004 Document Revised: 11/14/2011 Document Reviewed: 09/04/2011 [facility] Patient Information 2014 [place], [place]. Hysterectomy Information  A hysterectomy is a procedure where your uterus is surgically removed. It will no longer be possible to have menstrual periods or to become pregnant. The tubes and ovaries can be removed (bilateral salpingo-oopherectomy) during this surgery as well.  REASONS FOR A HYSTERECTOMY  Persistent, abnormal bleeding.  Lasting (chronic) pelvic pain or infection.  The lining of the uterus (endometrium) starts growing outside the uterus (endometriosis).  The endometrium starts growing in the muscle of the uterus (adenomyosis).  The uterus falls down into the vagina (pelvic organ prolapse).  Symptomatic uterine fibroids.  Precancerous cells.  Cervical cancer or uterine cancer. TYPES OF HYSTERECTOMIES  Supracervical hysterectomy. This type removes the top part of the uterus, but not the cervix.  Total hysterectomy. This type removes the uterus and cervix.  Radical hysterectomy. This type removes the uterus, cervix, and the fibrous tissue that holds the uterus in place in the pelvis (parametrium). WAYS A HYSTERECTOMY CAN BE PERFORMED  Abdominal hysterectomy. A large surgical cut (incision) is made in the abdomen. The uterus is removed through this incision.  Vaginal hysterectomy. An incision is made in the vagina. The uterus is removed through this incision. There are no abdominal incisions.  Conventional laparoscopic hysterectomy. A thin, lighted tube with a camera (laparoscope) is inserted into 3 or 4 small incisions in the abdomen. The uterus is cut into small pieces. The small pieces are removed through the incisions, or they are removed through the vagina.  Laparoscopic assisted vaginal hysterectomy (LAVH). Three or four small incisions are made in the abdomen. Part of the surgery is performed laparoscopically and part vaginally. The  uterus is removed through the vagina.  Robot-assisted laparoscopic hysterectomy. A laparoscope is inserted into 3 or 4 small incisions in the abdomen. A computer-controlled device is used to give the surgeon a 3D image. This allows for more precise movements of surgical instruments. The uterus is cut into small pieces and removed through the incisions or removed through the vagina. RISKS OF HYSTERECTOMY   Bleeding and risk of blood transfusion. Tell your caregiver if you do not want to receive any blood products.  Blood clots in the legs or lung.  Infection.  Injury to surrounding organs.  Anesthesia problems or side effects.  Conversion to an abdominal hysterectomy. WHAT TO EXPECT AFTER A HYSTERECTOMY  You will be given pain medicine.  You will need to have someone with you for the first 3 to 5 days after you go home.  You will need to follow up with your surgeon in 2 to 4 weeks after surgery to evaluate your progress.  You may have early menopause symptoms like hot flashes, night sweats, and insomnia.  If you had a hysterectomy for a problem that was not a cancer or a condition that could lead to cancer, then you no longer need Pap tests. However, even if you no longer need a Pap test, a regular exam is a good idea to make sure no other problems are starting. Document Released: 11/08/2000 Document Revised: 08/07/2011 Document  Reviewed: 12/24/2010 [employer] Patient Information 2014 [place], [place].

## 2013-02-03 ENCOUNTER — Other Ambulatory Visit: Payer: Self-pay | Admitting: *Deleted

## 2013-02-03 DIAGNOSIS — N939 Abnormal uterine and vaginal bleeding, unspecified: Secondary | ICD-10-CM

## 2013-02-03 DIAGNOSIS — N926 Irregular menstruation, unspecified: Secondary | ICD-10-CM

## 2013-02-03 DIAGNOSIS — D259 Leiomyoma of uterus, unspecified: Secondary | ICD-10-CM

## 2013-02-07 ENCOUNTER — Ambulatory Visit (HOSPITAL_COMMUNITY): Payer: BC Managed Care – PPO

## 2013-02-13 ENCOUNTER — Ambulatory Visit: Payer: BC Managed Care – PPO | Admitting: Obstetrics & Gynecology

## 2013-02-14 ENCOUNTER — Ambulatory Visit (HOSPITAL_COMMUNITY)
Admission: RE | Admit: 2013-02-14 | Discharge: 2013-02-14 | Disposition: A | Discharge status: Home / Self Care | Payer: BC Managed Care – PPO | Source: Ambulatory Visit | Attending: Obstetrics & Gynecology | Admitting: Obstetrics & Gynecology

## 2013-02-14 DIAGNOSIS — N949 Unspecified condition associated with female genital organs and menstrual cycle: Secondary | ICD-10-CM | POA: Insufficient documentation

## 2013-02-14 DIAGNOSIS — N938 Other specified abnormal uterine and vaginal bleeding: Secondary | ICD-10-CM | POA: Insufficient documentation

## 2013-02-14 DIAGNOSIS — N926 Irregular menstruation, unspecified: Secondary | ICD-10-CM

## 2013-02-14 DIAGNOSIS — K8020 Calculus of gallbladder without cholecystitis without obstruction: Secondary | ICD-10-CM | POA: Insufficient documentation

## 2013-02-14 DIAGNOSIS — D259 Leiomyoma of uterus, unspecified: Secondary | ICD-10-CM | POA: Insufficient documentation

## 2013-02-24 ENCOUNTER — Ambulatory Visit: Payer: BC Managed Care – PPO | Admitting: Obstetrics & Gynecology

## 2013-02-27 NOTE — Progress Notes (Signed)
Subjective:    Patient ID: Marissa Lewis, female    DOB: 24-Sep-1970, 42 y.o.   MRN: [id number]  HPI 42year-old Black female in today for health maintenance exam. She is complaining of vaginal discharge. Says it is foul-smelling. Also complaining of cerumen in the ears.  She is allergic to penicillin it causes rash and hives. She is possibly allergic to sulfa.  History of urticaria August 2011  Tonsillectomy and adenoidectomy 1980  Removal of right breast fibroadenoma in [facility] April 1989  Completed hepatitis B series in 1999  Dr. Tamela Oddi is her GYN physician but she wants me to do her Pap smear today.  History of thoracolumbar scoliosis. C-section x1. Natural childbirth times one. History of appendectomy with lysis of adhesions with appendectomy.  In 2005 she developed cholestatic jaundice which was thought to be due to oral contraceptives. 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  In 2008 she was seen by Dr. Leone Payor for elevated bilirubin. He found that she been taking herbal weight gain supplement and Megace that she got from someone else. He was trying to gain weight.  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 She is married and has 2 children. Does not smoke or consume alcohol.  Family history: Father with history of lung cancer. One brother and one sister. Mother with history of diabetes.                          Review of Systems  Constitutional: Positive for fatigue.  HENT: Positive for hearing loss.        Think she has cerumen impaction  Eyes: Negative.   Respiratory: Negative.   Genitourinary:       Foul-smelling vaginal discharge  Neurological: Negative.   Hematological: Negative.   Psychiatric/Behavioral: Negative.        Objective:   Physical Exam  Vitals reviewed. Constitutional: She is  oriented to person, place, and time. She appears well-developed and well-nourished. No distress.  HENT:  Head: Normocephalic and atraumatic.  Mouth/Throat: Oropharynx is clear and moist. No oropharyngeal exudate.  Cerumen impaction  Eyes: Conjunctivae and EOM are normal. Pupils are equal, round, and reactive to light. Right eye exhibits no discharge. Left eye exhibits no discharge. No scleral icterus.  Neck: Neck supple. No thyromegaly present.  Cardiovascular: Normal rate, regular rhythm, normal heart sounds and intact distal pulses.   No murmur heard. Pulmonary/Chest: Effort normal and breath sounds normal. No respiratory distress. She has no wheezes. She has no rales. She exhibits no tenderness.  Breasts normal female                                     Breast  Abdominal: Soft. Bowel sounds are normal.  Genitourinary:  Slight vaginal discharge. Wet prep shows clue cells. Pap taken. Bimanual consistent with fibroids  Musculoskeletal: She exhibits no edema.  Lymphadenopathy:    She has no cervical adenopathy.  Neurological: She is alert and oriented to person, place, and time. She has normal reflexes. No cranial nerve deficit. Coordination normal.  Skin: Skin is warm and dry. No rash noted. She is not diaphoretic.  Psychiatric: She has a normal mood and affect. Her behavior is normal. Judgment and thought content normal.          Assessment & Plan:   Impacted cerumen  Bacterial vaginosis  Occult blood on urine dipstick. Send urine specimen for microscopic urinalysis.  Uterine fibroids with menorrhagia and irregular menses. Needs to see GYN physician  Addendum: No significant hematuria noted on microscopic urine  Plan: Cerumen removed as best we could with irrigation today. For bacterial vaginosis use Cleocin vaginal cream each bedtime x7 days followed by one vinegar and water douche

## 2013-03-03 ENCOUNTER — Ambulatory Visit (INDEPENDENT_AMBULATORY_CARE_PROVIDER_SITE_OTHER): Payer: BC Managed Care – PPO | Admitting: Internal Medicine

## 2013-03-03 ENCOUNTER — Encounter: Payer: Self-pay | Admitting: Internal Medicine

## 2013-03-03 VITALS — BP 104/60 | HR 72 | Wt 135.5 lb

## 2013-03-03 DIAGNOSIS — F439 Reaction to severe stress, unspecified: Secondary | ICD-10-CM

## 2013-03-03 DIAGNOSIS — Z733 Stress, not elsewhere classified: Secondary | ICD-10-CM

## 2013-03-03 MED ORDER — FLUOXETINE HCL 20 MG PO TABS: 20.0000 mg | ORAL_TABLET | Freq: Every day | ORAL | Status: DC

## 2013-03-03 NOTE — Patient Instructions (Addendum)
Increase Prozac to 20 mg daily and return in 3 months.

## 2013-03-03 NOTE — Progress Notes (Signed)
  Subjective:    Patient ID: Marissa Lewis, female    DOB: [birth year], 42 y.o.   MRN: [id number]  HPI at last visit in mid August, we had a long discussion regarding decreased appetite. Her weight is stable. She's not lost more weight. She is on Prozac 10 mg daily. Says moods are better. Sleeping better. She's been taking Prozac at night. No side effects from it. She did see Ms. 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 I was concerned at last visit because she generally eats no more than one meal a day and this is been going on for some time perhaps years. 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 She does have grief reaction due to her father's illness. She will get influenza immunization through employment.    Review of Systems     Objective:   Physical Exam not examined. 15 minutes speaking with patient today.        Assessment & Plan:  Situational stress  Grief reaction  Anorexia  Plan: Increase Prozac to 20 mg daily and return in 3 months. She will have flu vaccine through employment.

## 2013-03-16 ENCOUNTER — Other Ambulatory Visit: Payer: Self-pay | Admitting: Internal Medicine

## 2013-06-05 ENCOUNTER — Ambulatory Visit (INDEPENDENT_AMBULATORY_CARE_PROVIDER_SITE_OTHER): Payer: BC Managed Care – PPO | Admitting: Obstetrics & Gynecology

## 2013-06-05 ENCOUNTER — Encounter: Payer: Self-pay | Admitting: Obstetrics & Gynecology

## 2013-06-05 VITALS — BP 118/79 | HR 71 | Temp 97.1°F | Ht 68.5 in | Wt 134.0 lb

## 2013-06-05 DIAGNOSIS — N946 Dysmenorrhea, unspecified: Secondary | ICD-10-CM

## 2013-06-05 DIAGNOSIS — N939 Abnormal uterine and vaginal bleeding, unspecified: Secondary | ICD-10-CM

## 2013-06-05 DIAGNOSIS — N926 Irregular menstruation, unspecified: Secondary | ICD-10-CM

## 2013-06-05 DIAGNOSIS — N944 Primary dysmenorrhea: Secondary | ICD-10-CM

## 2013-06-05 NOTE — Progress Notes (Signed)
Subjective:     Marissa Lewis is a 43 y.o. female here for a routine exam.  Current complaints: patient in office today to review results from an ultrasound she had performed in September 2014.  Personal health questionnaire reviewed: yes.   Gynecologic History Patient's last menstrual period was 05/24/2013. Contraception: none   Obstetric History OB History  No data available     The following portions of the patient's history were reviewed and updated as appropriate: allergies, current medications, past family history, past medical history, past social history, past surgical history and problem list.  Review of Systems Pertinent items are noted in HPI.    Objective:     Abd: NT; suprapubic area--firm Pelvic:  Uterus AV, NT; adnexa NT     Assessment:  Primary dysmenorrhea AUB   Plan:    Management/treatment options reviewed including LARC, diagnostic laparoscopy Review pain questionnaire Return in 2 weeks

## 2013-06-06 NOTE — Patient Instructions (Signed)
Diagnostic Laparoscopy Laparoscopy is a surgical procedure. It is used to diagnose and treat diseases inside the belly (abdomen). It is usually a brief, common, and relatively simple procedure. The laparoscopeis a thin, lighted, pencil-sized instrument. It is like a telescope. It is inserted into your abdomen through a small cut (incision). Your caregiver can look at the organs inside your body through this instrument. He or she can see if there is anything abnormal. Laparoscopy can be done either in a hospital or outpatient clinic. You may be given a mild sedative to help you relax before the procedure. Once in the operating room, you will be given a drug to make you sleep (general anesthesia). Laparoscopy usually lasts less than 1 hour. After the procedure, you will be monitored in a recovery area until you are stable and doing well. Once you are home, it will take 2 to 3 days to fully recover. RISKS AND COMPLICATIONS  Laparoscopy has relatively few risks. Your caregiver will discuss the risks with you before the procedure. Some problems that can occur include:  Infection.  Bleeding.  Damage to other organs.  Anesthetic side effects. PROCEDURE Once you receive anesthesia, your surgeon inflates the abdomen with a harmless gas (carbon dioxide). This makes the organs easier to see. The laparoscope is inserted into the abdomen through a small incision. This allows your surgeon to see into the abdomen. Other small instruments are also inserted into the abdomen through other small openings. Many surgeons attach a video camera to the laparoscope to enlarge the view. During a diagnostic laparoscopy, the surgeon may be looking for inflammation, infection, or cancer. Your surgeon may take tissue samples(biopsies). The samples are sent to a specialist in looking at cells and tissue samples (pathologist). The pathologist examines them under a microscope. Biopsies can help to diagnose or confirm a  disease. AFTER THE PROCEDURE   The gas is released from inside the abdomen.  The incisions are closed with stitches (sutures). Because these incisions are small (usually less than 1/2 inch), there is usually minimal discomfort after the procedure. There may be some mild discomfort in the throat. This is from the tube placed in the throat while you were sleeping. You may have some mild abdominal discomfort. There may also be discomfort from the instrument placement incisions in the abdomen.  The recovery time is shortened as long as there are no complications.  You will rest in a recovery room until stable and doing well. As long as there are no complications, you may be allowed to go home. FINDING OUT THE RESULTS OF YOUR TEST Not all test results are available during your visit. If your test results are not back during the visit, make an appointment with your caregiver to find out the results. Do not assume everything is normal if you have not heard from your caregiver or the medical facility. It is important for you to follow up on all of your test results. HOME CARE INSTRUCTIONS   Take all medicines as directed.  Only take over-the-counter or prescription medicines for pain, discomfort, or fever as directed by your caregiver.  Resume daily activities as directed.  Showers are preferred over baths.  You may resume sexual activities in 1 week or as directed.  Do not drive while taking narcotics. SEEK MEDICAL CARE IF:   There is increasing abdominal pain.  There is new pain in the shoulders (shoulder strap areas).  You feel lightheaded or faint.  You have the chills.  You or   your child has an oral temperature above 102 F (38.9 C).  There is pus-like (purulent) drainage from any of the wounds.  You are unable to pass gas or have a bowel movement.  You feel sick to your stomach (nauseous) or throw up (vomit). MAKE SURE YOU:   Understand these instructions.  Will watch  your condition.  Will get help right away if you are not doing well or get worse. Document Released: 08/21/2000 Document Revised: 09/09/2012 Document Reviewed: 05/15/2007 [facility] Patient Information 2014 [place], [place]. Levonorgestrel intrauterine device (IUD) What is this medicine? LEVONORGESTREL IUD (LEE voe nor jes trel) is a contraceptive (birth control) device. The device is placed inside the uterus by a healthcare professional. It is used to prevent pregnancy and can also be used to treat heavy bleeding that occurs during your period. Depending on the device, it can be used for 3 to 5 years. This medicine may be used for other purposes; ask your health care provider or pharmacist if you have questions. COMMON BRAND NAME(S): Jerral Bonito What should I tell my health care provider before I take this medicine? They need to know if you have any of these conditions: -abnormal Pap smear -cancer of the breast, uterus, or cervix -diabetes -endometritis -genital or pelvic infection now or in the past -have more than one sexual partner or your partner has more than one partner -heart disease -history of an ectopic or tubal pregnancy -immune system problems -IUD in place -liver disease or tumor -problems with blood clots or take blood-thinners -use intravenous drugs -uterus of unusual shape -vaginal bleeding that has not been explained -an unusual or allergic reaction to levonorgestrel, other hormones, silicone, or polyethylene, medicines, foods, dyes, or preservatives -pregnant or trying to get pregnant -breast-feeding How should I use this medicine? This device is placed inside the uterus by a health care professional. Talk to your pediatrician regarding the use of this medicine in children. Special care may be needed. Overdosage: If you think you have taken too much of this medicine contact a poison control center or emergency room at once. NOTE: This medicine is only for you. Do  not share this medicine with others. What if I miss a dose? This does not apply. What may interact with this medicine? Do not take this medicine with any of the following medications: -amprenavir -bosentan -fosamprenavir This medicine may also interact with the following medications: -aprepitant -barbiturate medicines for inducing sleep or treating seizures -bexarotene -griseofulvin -medicines to treat seizures like carbamazepine, ethotoin, felbamate, oxcarbazepine, phenytoin, topiramate -modafinil -pioglitazone -rifabutin -rifampin -rifapentine -some medicines to treat HIV infection like atazanavir, indinavir, lopinavir, nelfinavir, tipranavir, ritonavir -St. John's wort -warfarin This list may not describe all possible interactions. Give your health care provider a list of all the medicines, herbs, non-prescription drugs, or dietary supplements you use. Also tell them if you smoke, drink alcohol, or use illegal drugs. Some items may interact with your medicine. What should I watch for while using this medicine? Visit your doctor or health care professional for regular check ups. See your doctor if you or your partner has sexual contact with others, becomes HIV positive, or gets a sexual transmitted disease. This product does not protect you against HIV infection (AIDS) or other sexually transmitted diseases. You can check the placement of the IUD yourself by reaching up to the top of your vagina with clean fingers to feel the threads. Do not pull on the threads. It is a good habit to check placement after  each menstrual period. Call your doctor right away if you feel more of the IUD than just the threads or if you cannot feel the threads at all. The IUD may come out by itself. You may become pregnant if the device comes out. If you notice that the IUD has come out use a backup birth control method like condoms and call your health care provider. Using tampons will not change the  position of the IUD and are okay to use during your period. What side effects may I notice from receiving this medicine? Side effects that you should report to your doctor or health care professional as soon as possible: -allergic reactions like skin rash, itching or hives, swelling of the face, lips, or tongue -fever, flu-like symptoms -genital sores -high blood pressure -no menstrual period for 6 weeks during use -pain, swelling, warmth in the leg -pelvic pain or tenderness -severe or sudden headache -signs of pregnancy -stomach cramping -sudden shortness of breath -trouble with balance, talking, or walking -unusual vaginal bleeding, discharge -yellowing of the eyes or skin Side effects that usually do not require medical attention (report to your doctor or health care professional if they continue or are bothersome): -acne -breast pain -change in sex drive or performance -changes in weight -cramping, dizziness, or faintness while the device is being inserted -headache -irregular menstrual bleeding within first 3 to 6 months of use -nausea This list may not describe all possible side effects. Call your doctor for medical advice about side effects. You may report side effects to FDA at 1-800-FDA-1088. Where should I keep my medicine? This does not apply. NOTE: This sheet is a summary. It may not cover all possible information. If you have questions about this medicine, talk to your doctor, pharmacist, or health care provider.  2014, [employer]. (2011-06-15 13:54:04)

## 2013-06-09 ENCOUNTER — Ambulatory Visit: Payer: BC Managed Care – PPO | Admitting: Internal Medicine

## 2013-06-23 ENCOUNTER — Ambulatory Visit: Payer: BC Managed Care – PPO | Admitting: Internal Medicine

## 2013-06-23 ENCOUNTER — Ambulatory Visit: Payer: BC Managed Care – PPO | Admitting: Obstetrics & Gynecology

## 2013-08-24 ENCOUNTER — Encounter (HOSPITAL_COMMUNITY): Payer: Self-pay | Admitting: Emergency Medicine

## 2013-08-24 ENCOUNTER — Emergency Department (INDEPENDENT_AMBULATORY_CARE_PROVIDER_SITE_OTHER)
Admission: EM | Admit: 2013-08-24 | Discharge: 2013-08-24 | Disposition: A | Discharge status: Home / Self Care | Payer: BC Managed Care – PPO | Source: Home / Self Care | Attending: Family Medicine | Admitting: Family Medicine

## 2013-08-24 DIAGNOSIS — N390 Urinary tract infection, site not specified: Secondary | ICD-10-CM | POA: Diagnosis present

## 2013-08-24 HISTORY — DX: Urinary tract infection, site not specified: N39.0

## 2013-08-24 LAB — POCT URINALYSIS DIP (DEVICE)
Bilirubin Urine: NEGATIVE
Glucose, UA: NEGATIVE mg/dL
Ketones, ur: NEGATIVE mg/dL
Nitrite: POSITIVE — AB
Protein, ur: 100 mg/dL — AB
Specific Gravity, Urine: 1.02 (ref 1.005–1.030)
Urobilinogen, UA: 1 mg/dL (ref 0.0–1.0)
pH: 5.5 (ref 5.0–8.0)

## 2013-08-24 LAB — POCT PREGNANCY, URINE: Preg Test, Ur: NEGATIVE

## 2013-08-24 MED ORDER — LIDOCAINE HCL (PF) 1 % IJ SOLN
INTRAMUSCULAR | Status: AC
  Filled 2013-08-24: qty 5

## 2013-08-24 MED ORDER — ACETAMINOPHEN 325 MG PO TABS
975.0000 mg | ORAL_TABLET | Freq: Once | ORAL | Status: AC
  Administered 2013-08-24: 975 mg via ORAL

## 2013-08-24 MED ORDER — ACETAMINOPHEN 325 MG PO TABS
ORAL_TABLET | ORAL | Status: AC
  Filled 2013-08-24: qty 3

## 2013-08-24 MED ORDER — CEPHALEXIN 500 MG PO CAPS: 500.0000 mg | ORAL_CAPSULE | Freq: Three times a day (TID) | ORAL | Status: DC

## 2013-08-24 MED ORDER — ONDANSETRON HCL 4 MG PO TABS: 4.0000 mg | ORAL_TABLET | Freq: Three times a day (TID) | ORAL | Status: DC | PRN

## 2013-08-24 MED ORDER — CEFTRIAXONE SODIUM 1 G IJ SOLR
1.0000 g | Freq: Once | INTRAMUSCULAR | Status: AC
  Administered 2013-08-24: 1 g via INTRAMUSCULAR

## 2013-08-24 MED ORDER — CEFTRIAXONE SODIUM 1 G IJ SOLR
INTRAMUSCULAR | Status: AC
  Filled 2013-08-24: qty 10

## 2013-08-24 NOTE — ED Notes (Signed)
S/S allergic reaction reviewed w/ pt.  Instructed to get staff member immediately if any sxs.  Comfort measures provided.

## 2013-08-24 NOTE — ED Notes (Signed)
Started with frequent urination 3 days ago; 2 days ago started with dysuria and hematuria.  Now has fever, low back pain, and weakness.  Has not taken any meds.

## 2013-08-24 NOTE — ED Notes (Signed)
Son at bedside.

## 2013-08-24 NOTE — ED Provider Notes (Signed)
CSN: [id number]     Arrival date & time 08/24/13  1856 History   First MD Initiated Contact with Patient 08/24/13 1028     Chief Complaint  Patient presents with  . Urinary Tract Infection  . Fever   (Consider location/radiation/quality/duration/timing/severity/associated sxs/prior Treatment) HPI  43 year old F with 4 days history of dysuria and urgency. In the last 24 hours, she developed a fever. She is taking Tylenol for relief. She has no recent history of urinary tract infections. She denies nausea, vomiting, and flank pain. She does have decreased appetite today.  Past Medical History  Diagnosis Date  . Depression   . Gilbert's syndrome   . UTI (lower urinary tract infection)    Past Surgical History  Procedure Laterality Date  . Uterine fibroid surgery    . Cesarean section    . Appendectomy    . Tonsillectomy     Family History  Problem Relation Age of Onset  . Diabetes Mother   . Diabetes Father   . Cancer Maternal Grandmother     breast   History  Substance Use Topics  . Smoking status: Never Smoker   . Smokeless tobacco: Not on file  . Alcohol Use: No   OB History   Grav Para Term Preterm Abortions TAB SAB Ect Mult Living                 Review of Systems  Allergies  Penicillins and Sulfa antibiotics  Home Medications   Current Outpatient Rx  Name  Route  Sig  Dispense  Refill  . acetaminophen (TYLENOL) 325 MG tablet   Oral   Take 650 mg by mouth every 6 (six) hours as needed for pain.         . cephALEXin (KEFLEX) 500 MG capsule   Oral   Take 1 capsule (500 mg total) by mouth 3 (three) times daily. Take for 10 days.   30 capsule   0   . FLUoxetine (PROZAC) 20 MG tablet   Oral   Take 1 tablet (20 mg total) by mouth daily.   30 tablet   3   . ondansetron (ZOFRAN) 4 MG tablet   Oral   Take 1 tablet (4 mg total) by mouth every 8 (eight) hours as needed for nausea or vomiting.   20 tablet   0    BP 101/62  Pulse 96  Temp(Src)  101.7 F (38.7 C) (Oral)  Resp 12  SpO2 100%  LMP 08/11/2013 Physical Exam Gen: middle age female, ill appearing, but non toxic HEENT:OP clear and moist CV: RRR, no m/r/g, no JVD or carotid bruits Pulm: normal WOB, CTA-B Abd: soft, NDNT, NABS Back: no CVA tenderness  ED Course  Procedures (including critical care time) Labs Review Labs Reviewed  POCT URINALYSIS DIP (DEVICE) - Abnormal; Notable for the following:    Hgb urine dipstick LARGE (*)    Protein, ur 100 (*)    Nitrite POSITIVE (*)    Leukocytes, UA LARGE (*)    All other components within normal limits  POCT PREGNANCY, URINE   Imaging Review No results found.   MDM   1. Complicated UTI (urinary tract infection)    Patient given injection of ceftriaxone 1 g IM while in the office. Also given Tylenol for fever. She was able to tolerate by mouth, therefore appropriate for treatment as an outpatient workup for a UTI. Given precautions for return. We'll followup cultures.    Angelica Ran,  MD 08/24/13 1111

## 2013-08-24 NOTE — Discharge Instructions (Signed)
Marissa Lewis,   You have a urinary tract infection that could have spread to the kidney which causes a fever. You were given a shot of ceftriaxone. I want you to start an oral antibiotic later today and take it for 10 days. If you develop nausea and vomiting, and please return because you'll need IV antibiotics. you may take nausea medication before you take the tablets if this helps.  I hope you feel better soon.  Dr. Maricela Bo

## 2013-08-26 ENCOUNTER — Encounter: Payer: Self-pay | Admitting: Internal Medicine

## 2013-08-27 NOTE — ED Provider Notes (Signed)
Medical screening examination/treatment/procedure(s) were performed by a resident physician or non-physician practitioner and as the supervising physician I was immediately available for consultation/collaboration.  Jolina Symonds, MD    Sujey Gundry S Marco Adelson, MD 08/27/13 0756 

## 2013-08-28 ENCOUNTER — Encounter: Payer: Self-pay | Admitting: Internal Medicine

## 2013-08-28 ENCOUNTER — Ambulatory Visit (INDEPENDENT_AMBULATORY_CARE_PROVIDER_SITE_OTHER): Payer: BC Managed Care – PPO | Admitting: Internal Medicine

## 2013-08-28 VITALS — BP 110/78 | HR 76 | Temp 98.8°F | Wt 143.0 lb

## 2013-08-28 DIAGNOSIS — N10 Acute tubulo-interstitial nephritis: Secondary | ICD-10-CM

## 2013-08-28 LAB — POCT URINALYSIS DIPSTICK
Bilirubin, UA: NEGATIVE
Glucose, UA: NEGATIVE
Ketones, UA: NEGATIVE
Nitrite, UA: NEGATIVE
Protein, UA: NEGATIVE
Spec Grav, UA: 1.02
Urobilinogen, UA: NEGATIVE
pH, UA: 6

## 2013-08-28 MED ORDER — CEFTRIAXONE SODIUM 1 G IJ SOLR
1.0000 g | Freq: Once | INTRAMUSCULAR | Status: AC
  Administered 2013-08-28: 1 g via INTRAMUSCULAR

## 2013-08-28 NOTE — Progress Notes (Signed)
   Subjective:    Patient ID: Marissa Lewis, female    DOB: March 19, 1971, 43 y.o.   MRN: [id number]  HPI  Seen at [facility] March 29 with dysuria, fever and urinary frequency. Pt. tells me frequency started March 25th. Subsequently developed fever. Has had vomiting and flank pain. Had abnormal U/A at Urgent Care. Cannot find culture in EPIC although notes indicate culture was to be ordered. Pt. c/o of foul smelling urine that is improving. She received injection of Rocephin at Urgent Care and was placed on Keflex 500 mg tid. Has not been to work this week as she still feels ill. Nausea has improved. No longer vomiting.    Review of Systems     Objective:   Physical Exam Dipstick U/A still abnormal- see Epic for results. Culture ordered.       Assessment & Plan:   Pyelonephritis  Plan: Rocephin 1 g IM given today. Continue Keflex 500 mg by mouth 3 times a day for 10 days pending culture with note to lab that patient is already on Keflex. Will need followup urinalysis in 2 weeks. Call if  symptoms worsen. Note given to return to work Monday, April 6.

## 2013-08-28 NOTE — Patient Instructions (Addendum)
Continue  Keflex to complete a ten-day course. Rocephin 1 g IM given today. Note given to be out of work this entire week. Culture pending today. Needs followup urinalysis in 2 weeks

## 2013-08-29 LAB — URINE CULTURE
Colony Count: NO GROWTH
Organism ID, Bacteria: NO GROWTH

## 2013-10-27 ENCOUNTER — Other Ambulatory Visit: Payer: Self-pay | Admitting: Internal Medicine

## 2013-10-30 ENCOUNTER — Ambulatory Visit (INDEPENDENT_AMBULATORY_CARE_PROVIDER_SITE_OTHER): Payer: BC Managed Care – PPO | Admitting: Internal Medicine

## 2013-10-30 ENCOUNTER — Encounter: Payer: Self-pay | Admitting: Internal Medicine

## 2013-10-30 VITALS — BP 108/68 | HR 64 | Temp 99.5°F | Wt 150.0 lb

## 2013-10-30 DIAGNOSIS — N760 Acute vaginitis: Secondary | ICD-10-CM

## 2013-10-30 DIAGNOSIS — B9689 Other specified bacterial agents as the cause of diseases classified elsewhere: Secondary | ICD-10-CM

## 2013-10-30 DIAGNOSIS — A499 Bacterial infection, unspecified: Secondary | ICD-10-CM

## 2013-10-30 MED ORDER — METRONIDAZOLE 500 MG PO TABS: 500.0000 mg | ORAL_TABLET | Freq: Two times a day (BID) | ORAL | Status: DC

## 2013-10-30 NOTE — Progress Notes (Signed)
   Subjective:    Patient ID: Marissa Lewis, female    DOB: 09-Nov-1970, 43 y.o.   MRN: [id number]  HPI Recurrence of bacterial vaginosis. Prescription from April 2014 had expired. Patient does not want another wet prep done today. Also a she's been taking an over-the-counter supplement to gain weight. Weight today is 150 pounds. No longer taking Prozac for weight issues. Oldest son is graduating from high school and will be attending [employer]. She and her husband will be separating. Youngest son is 53 years old and attends a Geographical information systems officer school. She has a new job with [employer]. She travels to several different counties and likes that.    Review of Systems     Objective:   Physical Exam  Not examined. Spent 15 minutes speaking with patient about these issues. Review of weights indicate patient weighed 143 pounds April 2015, 134 pounds January 2015, 135 pounds October 2014, 135 1/2 pounds may 2014. Today's weight is 150 pounds.      Assessment & Plan:  Situational stress-looks well and is happy and has gained weight. Doesn't need to gain any more weight however.  Bacterial vaginosis-patient prefers to try Flagyl 500 mg twice daily for 7 days and given refills. This is to be followed by vinegar and water douche after seven-day course.

## 2013-10-30 NOTE — Patient Instructions (Addendum)
Take Flagyl 500 mg twice a day for 7 days with refills.

## 2014-04-13 ENCOUNTER — Telehealth: Payer: Self-pay

## 2014-04-13 ENCOUNTER — Encounter: Payer: Self-pay | Admitting: Internal Medicine

## 2014-04-13 ENCOUNTER — Ambulatory Visit (INDEPENDENT_AMBULATORY_CARE_PROVIDER_SITE_OTHER): Payer: PRIVATE HEALTH INSURANCE | Admitting: Internal Medicine

## 2014-04-13 VITALS — BP 90/60 | HR 114 | Temp 102.4°F | Wt 166.0 lb

## 2014-04-13 DIAGNOSIS — R319 Hematuria, unspecified: Secondary | ICD-10-CM | POA: Diagnosis not present

## 2014-04-13 DIAGNOSIS — N390 Urinary tract infection, site not specified: Secondary | ICD-10-CM | POA: Diagnosis not present

## 2014-04-13 DIAGNOSIS — N10 Acute tubulo-interstitial nephritis: Secondary | ICD-10-CM

## 2014-04-13 DIAGNOSIS — R509 Fever, unspecified: Secondary | ICD-10-CM | POA: Diagnosis not present

## 2014-04-13 LAB — CBC WITH DIFFERENTIAL/PLATELET
Basophils Absolute: 0 10*3/uL (ref 0.0–0.1)
Eosinophils Absolute: 0 10*3/uL (ref 0.0–0.7)
HCT: 41.1 % (ref 36.0–46.0)
Hemoglobin: 12.6 g/dL (ref 12.0–15.0)
Lymphocytes Relative: 8 % — ABNORMAL LOW (ref 12–46)
Lymphs Abs: 1.1 10*3/uL (ref 0.7–4.0)
MCH: 28.4 pg (ref 26.0–34.0)
MCHC: 30.8 g/dL (ref 30.0–36.0)
MCV: 92.6 fL (ref 78.0–100.0)
Monocytes Absolute: 0.6 10*3/uL (ref 0.1–1.0)
Monocytes Relative: 4 % (ref 3–12)
Neutro Abs: 12.6 10*3/uL — ABNORMAL HIGH (ref 1.7–7.7)
Neutrophils Relative %: 88 % — ABNORMAL HIGH (ref 43–77)
Platelets: 198 10*3/uL (ref 150–400)
RBC: 4.44 MIL/uL (ref 3.87–5.11)
RDW: 12.5 % (ref 11.5–15.5)
WBC: 14.3 10*3/uL — ABNORMAL HIGH (ref 4.0–10.5)

## 2014-04-13 LAB — POCT URINALYSIS DIPSTICK
Bilirubin, UA: NEGATIVE
Glucose, UA: NEGATIVE
Ketones, UA: NEGATIVE
Spec Grav, UA: 1.015
Urobilinogen, UA: NEGATIVE
pH, UA: 6

## 2014-04-13 LAB — URINALYSIS, MICROSCOPIC ONLY
Casts: NONE SEEN
Crystals: NONE SEEN
WBC, UA: >50 WBC/hpf — AB (ref <3)

## 2014-04-13 MED ORDER — PROMETHAZINE HCL 25 MG/ML IJ SOLN
25.0000 mg | Freq: Once | INTRAMUSCULAR | Status: AC
  Administered 2014-04-13: 25 mg via INTRAMUSCULAR

## 2014-04-13 MED ORDER — CIPROFLOXACIN HCL 500 MG PO TABS: 500.0000 mg | ORAL_TABLET | Freq: Two times a day (BID) | ORAL | Status: DC

## 2014-04-13 MED ORDER — HYDROCODONE-ACETAMINOPHEN 5-325 MG PO TABS: 1.0000 | ORAL_TABLET | ORAL | Status: DC | PRN

## 2014-04-13 MED ORDER — CEFTRIAXONE SODIUM 1 G IJ SOLR
1.0000 g | Freq: Once | INTRAMUSCULAR | Status: AC
  Administered 2014-04-13: 1 g via INTRAMUSCULAR

## 2014-04-13 MED ORDER — PROMETHAZINE HCL 25 MG PO TABS
25.0000 mg | ORAL_TABLET | ORAL | Status: DC | PRN
Start: 2014-04-13 — End: 2017-08-21

## 2014-04-13 NOTE — Telephone Encounter (Signed)
-----   Message from Elby Showers, MD sent at 04/13/2014  2:14 PM EST ----- Please call pt. WBC elevated at 14,300. Take meds as prescribed. See her Thursday or sooner if worse.

## 2014-04-13 NOTE — Telephone Encounter (Signed)
Patient informed of lab results and directions.

## 2014-04-15 LAB — URINE CULTURE: Colony Count: >=100000

## 2014-04-16 ENCOUNTER — Ambulatory Visit (INDEPENDENT_AMBULATORY_CARE_PROVIDER_SITE_OTHER): Payer: PRIVATE HEALTH INSURANCE | Admitting: Internal Medicine

## 2014-04-16 ENCOUNTER — Encounter: Payer: Self-pay | Admitting: Internal Medicine

## 2014-04-16 VITALS — BP 100/70 | HR 92 | Temp 98.1°F | Wt 166.0 lb

## 2014-04-16 DIAGNOSIS — R319 Hematuria, unspecified: Secondary | ICD-10-CM

## 2014-04-16 DIAGNOSIS — N390 Urinary tract infection, site not specified: Secondary | ICD-10-CM

## 2014-04-16 DIAGNOSIS — N10 Acute tubulo-interstitial nephritis: Secondary | ICD-10-CM | POA: Diagnosis not present

## 2014-04-16 LAB — POCT URINALYSIS DIPSTICK
Bilirubin, UA: NEGATIVE
Glucose, UA: NEGATIVE
Ketones, UA: NEGATIVE
Leukocytes, UA: NEGATIVE
Nitrite, UA: NEGATIVE
Spec Grav, UA: >=1.03
Urobilinogen, UA: NEGATIVE
pH, UA: 6

## 2014-05-07 ENCOUNTER — Ambulatory Visit: Payer: PRIVATE HEALTH INSURANCE | Admitting: Internal Medicine

## 2014-05-25 ENCOUNTER — Encounter: Payer: Self-pay | Admitting: *Deleted

## 2014-07-05 ENCOUNTER — Encounter: Payer: Self-pay | Admitting: Internal Medicine

## 2014-07-05 NOTE — Patient Instructions (Addendum)
Cipro 500 mg twice daily for 10 days. Return on November 19 for follow-up. Take Phenergan as needed for nausea and Norco as needed for pain.

## 2014-07-05 NOTE — Progress Notes (Addendum)
   Subjective:    Patient ID: Marissa Lewis, female    DOB: [birth year], 44 y.o.   MRN: [id number]  HPI  Follow-up from April 14, 2015  visit for pyelonephritis. Feeling better. Urinary symptoms have improved. Less lethargy and fatigue. She was placed on Cipro 500 mg twice daily, given prescription for oral Phenergan and Norco 5/325 . White blood cell count was 14,300 with a left shift and she had a temperature of 102.4 orally. Able to eat some and take in fluids without nausea and vomiting.    Review of Systems     Objective:   Physical Exam  No CVA tenderness. Urinalysis improved. Urine culture reviewed: greater than 100,000 colonies per milliliter Escherichia coli sensitive to Cipro which she is on      Assessment & Plan:  Acute pyelonephritis-improved  Plan: Finish course of Cipro to complete a 10 day course. Return as needed.

## 2014-07-05 NOTE — Patient Instructions (Signed)
Finish course of Cipro was prescribed to complete a 10 day course.

## 2014-07-05 NOTE — Progress Notes (Signed)
   Subjective:    Patient ID: Marissa Lewis, female    DOB: 27-Oct-1970, 44 y.o.   MRN: [id number]  HPI  44 year old Black Female in today with urinary tract infection symptoms onset last week. She took over-the-counter Azo-Standard this past weekend. She now has nausea, back pain, and chills as well as dysuria and frequency. She looks acutely ill.    Review of Systems     Objective:   Physical Exam Lethargic. Urinalysis is abnormal. Culture taken. CBC shows white blood cell count of 14,300 with a left shift. She has mild CVA tenderness temperature is 102.4 degrees       Assessment & Plan:  Acute pyelonephritis  Plan:  Cipro 500 mg twice daily for 10 days. Return for follow-up November 19. Phenergan 25 mg tablets every 4 hours as needed for nausea. Norco 5/325 one by mouth every 6 hours when necessary pain

## 2016-07-19 DIAGNOSIS — I340 Nonrheumatic mitral (valve) insufficiency: Secondary | ICD-10-CM | POA: Diagnosis not present

## 2016-07-19 DIAGNOSIS — R0789 Other chest pain: Secondary | ICD-10-CM | POA: Diagnosis not present

## 2016-08-08 DIAGNOSIS — I340 Nonrheumatic mitral (valve) insufficiency: Secondary | ICD-10-CM | POA: Diagnosis not present

## 2016-08-09 DIAGNOSIS — R0789 Other chest pain: Secondary | ICD-10-CM | POA: Diagnosis not present

## 2016-08-11 DIAGNOSIS — R0789 Other chest pain: Secondary | ICD-10-CM | POA: Diagnosis not present

## 2016-08-14 DIAGNOSIS — I10 Essential (primary) hypertension: Secondary | ICD-10-CM | POA: Diagnosis not present

## 2016-08-14 DIAGNOSIS — I341 Nonrheumatic mitral (valve) prolapse: Secondary | ICD-10-CM | POA: Diagnosis not present

## 2016-08-14 DIAGNOSIS — R0789 Other chest pain: Secondary | ICD-10-CM | POA: Diagnosis not present

## 2016-09-18 ENCOUNTER — Emergency Department (HOSPITAL_COMMUNITY): Payer: Self-pay

## 2016-09-18 ENCOUNTER — Emergency Department (HOSPITAL_COMMUNITY)
Admission: EM | Admit: 2016-09-18 | Discharge: 2016-09-19 | Disposition: A | Discharge status: Home / Self Care | Payer: Self-pay | Attending: Emergency Medicine | Admitting: Emergency Medicine

## 2016-09-18 DIAGNOSIS — Z5321 Procedure and treatment not carried out due to patient leaving prior to being seen by health care provider: Secondary | ICD-10-CM | POA: Insufficient documentation

## 2016-09-18 DIAGNOSIS — R0789 Other chest pain: Secondary | ICD-10-CM | POA: Insufficient documentation

## 2016-09-18 DIAGNOSIS — Z79899 Other long term (current) drug therapy: Secondary | ICD-10-CM | POA: Insufficient documentation

## 2016-09-18 DIAGNOSIS — R0602 Shortness of breath: Secondary | ICD-10-CM | POA: Insufficient documentation

## 2016-09-18 MED ORDER — ALBUTEROL SULFATE (2.5 MG/3ML) 0.083% IN NEBU
5.0000 mg | INHALATION_SOLUTION | Freq: Once | RESPIRATORY_TRACT | Status: AC
  Administered 2016-09-18: 5 mg via RESPIRATORY_TRACT
  Filled 2016-09-18: qty 6

## 2016-09-18 NOTE — ED Triage Notes (Signed)
Pt c/o SOB since being at work today. Feels as if she cannot catch her breath. Worsening with activity.  Denies cough, Endorses some chest tightness. Recently dx with mitral valve issues.

## 2016-09-19 NOTE — ED Notes (Signed)
Pt left without being seen, pt stated wait was too long

## 2016-09-19 NOTE — ED Notes (Signed)
Bed: [facility] Expected date:  Expected time:  Means of arrival:  Comments: 

## 2017-08-01 ENCOUNTER — Other Ambulatory Visit: Payer: Self-pay | Admitting: Internal Medicine

## 2017-08-01 DIAGNOSIS — Z1329 Encounter for screening for other suspected endocrine disorder: Secondary | ICD-10-CM

## 2017-08-01 DIAGNOSIS — Z13220 Encounter for screening for lipoid disorders: Secondary | ICD-10-CM

## 2017-08-01 DIAGNOSIS — E559 Vitamin D deficiency, unspecified: Secondary | ICD-10-CM

## 2017-08-01 DIAGNOSIS — Z0000 Encounter for general adult medical examination without abnormal findings: Secondary | ICD-10-CM

## 2017-08-14 ENCOUNTER — Other Ambulatory Visit: Payer: PRIVATE HEALTH INSURANCE | Admitting: Internal Medicine

## 2017-08-14 DIAGNOSIS — Z13220 Encounter for screening for lipoid disorders: Secondary | ICD-10-CM

## 2017-08-14 DIAGNOSIS — E559 Vitamin D deficiency, unspecified: Secondary | ICD-10-CM

## 2017-08-14 DIAGNOSIS — R001 Bradycardia, unspecified: Secondary | ICD-10-CM | POA: Diagnosis not present

## 2017-08-14 DIAGNOSIS — Z1329 Encounter for screening for other suspected endocrine disorder: Secondary | ICD-10-CM

## 2017-08-14 DIAGNOSIS — I341 Nonrheumatic mitral (valve) prolapse: Secondary | ICD-10-CM | POA: Diagnosis not present

## 2017-08-14 DIAGNOSIS — R0789 Other chest pain: Secondary | ICD-10-CM | POA: Diagnosis not present

## 2017-08-14 DIAGNOSIS — Z298 Encounter for other specified prophylactic measures: Secondary | ICD-10-CM | POA: Diagnosis not present

## 2017-08-14 DIAGNOSIS — Z0000 Encounter for general adult medical examination without abnormal findings: Secondary | ICD-10-CM

## 2017-08-15 LAB — CBC WITH DIFFERENTIAL/PLATELET
Basophils Absolute: 51 cells/uL (ref 0–200)
Basophils Relative: 0.9 %
Eosinophils Absolute: 91 cells/uL (ref 15–500)
Eosinophils Relative: 1.6 %
HCT: 38.7 % (ref 35.0–45.0)
Hemoglobin: 13.1 g/dL (ref 11.7–15.5)
Lymphs Abs: 1949 cells/uL (ref 850–3900)
MCH: 30.5 pg (ref 27.0–33.0)
MCHC: 33.9 g/dL (ref 32.0–36.0)
MCV: 90 fL (ref 80.0–100.0)
MPV: 10.1 fL (ref 7.5–12.5)
Monocytes Relative: 6.2 %
Neutro Abs: 3255 cells/uL (ref 1500–7800)
Neutrophils Relative %: 57.1 %
Platelets: 266 10*3/uL (ref 140–400)
RBC: 4.3 10*6/uL (ref 3.80–5.10)
RDW: 12.4 % (ref 11.0–15.0)
Total Lymphocyte: 34.2 %
WBC mixed population: 353 cells/uL (ref 200–950)
WBC: 5.7 10*3/uL (ref 3.8–10.8)

## 2017-08-15 LAB — LIPID PANEL
Cholesterol: 188 mg/dL (ref <200)
HDL: 69 mg/dL (ref >50)
LDL Cholesterol (Calc): 104 mg/dL (calc) — ABNORMAL HIGH
Non-HDL Cholesterol (Calc): 119 mg/dL (calc) (ref <130)
Total CHOL/HDL Ratio: 2.7 (calc) (ref <5.0)
Triglycerides: 60 mg/dL (ref <150)

## 2017-08-15 LAB — COMPLETE METABOLIC PANEL WITH GFR
AG Ratio: 1.9 (calc) (ref 1.0–2.5)
ALT: 11 U/L (ref 6–29)
AST: 13 U/L (ref 10–30)
Albumin: 4.4 g/dL (ref 3.6–5.1)
Alkaline phosphatase (APISO): 42 U/L (ref 33–115)
BUN: 10 mg/dL (ref 7–25)
CO2: 27 mmol/L (ref 20–32)
Calcium: 8.9 mg/dL (ref 8.6–10.2)
Chloride: 108 mmol/L (ref 98–110)
Creat: 0.87 mg/dL (ref 0.50–1.10)
GFR, Est African American: 94 mL/min/{1.73_m2} (ref >=60)
GFR, Est Non African American: 81 mL/min/{1.73_m2} (ref >=60)
Globulin: 2.3 g/dL (calc) (ref 1.9–3.7)
Glucose, Bld: 88 mg/dL (ref 65–99)
Potassium: 3.9 mmol/L (ref 3.5–5.3)
Sodium: 139 mmol/L (ref 135–146)
Total Bilirubin: 2.1 mg/dL — ABNORMAL HIGH (ref 0.2–1.2)
Total Protein: 6.7 g/dL (ref 6.1–8.1)

## 2017-08-15 LAB — TSH: TSH: 0.55 mIU/L

## 2017-08-15 LAB — VITAMIN D 25 HYDROXY (VIT D DEFICIENCY, FRACTURES): Vit D, 25-Hydroxy: 6 ng/mL — ABNORMAL LOW (ref 30–100)

## 2017-08-21 ENCOUNTER — Ambulatory Visit (INDEPENDENT_AMBULATORY_CARE_PROVIDER_SITE_OTHER): Payer: PRIVATE HEALTH INSURANCE | Admitting: Internal Medicine

## 2017-08-21 ENCOUNTER — Other Ambulatory Visit (HOSPITAL_COMMUNITY)
Admission: RE | Admit: 2017-08-21 | Discharge: 2017-08-21 | Disposition: A | Discharge status: Home / Self Care | Payer: No Typology Code available for payment source | Source: Ambulatory Visit | Attending: Internal Medicine | Admitting: Internal Medicine

## 2017-08-21 ENCOUNTER — Encounter: Payer: Self-pay | Admitting: Internal Medicine

## 2017-08-21 VITALS — BP 102/70 | HR 73 | Ht 68.0 in | Wt 164.0 lb

## 2017-08-21 DIAGNOSIS — Z1231 Encounter for screening mammogram for malignant neoplasm of breast: Secondary | ICD-10-CM | POA: Diagnosis not present

## 2017-08-21 DIAGNOSIS — N760 Acute vaginitis: Secondary | ICD-10-CM | POA: Insufficient documentation

## 2017-08-21 DIAGNOSIS — R319 Hematuria, unspecified: Secondary | ICD-10-CM | POA: Diagnosis not present

## 2017-08-21 DIAGNOSIS — I340 Nonrheumatic mitral (valve) insufficiency: Secondary | ICD-10-CM

## 2017-08-21 DIAGNOSIS — Z0000 Encounter for general adult medical examination without abnormal findings: Secondary | ICD-10-CM

## 2017-08-21 DIAGNOSIS — E559 Vitamin D deficiency, unspecified: Secondary | ICD-10-CM

## 2017-08-21 DIAGNOSIS — Z124 Encounter for screening for malignant neoplasm of cervix: Secondary | ICD-10-CM | POA: Diagnosis not present

## 2017-08-21 DIAGNOSIS — N946 Dysmenorrhea, unspecified: Secondary | ICD-10-CM

## 2017-08-21 DIAGNOSIS — Z298 Encounter for other specified prophylactic measures: Secondary | ICD-10-CM

## 2017-08-21 DIAGNOSIS — B9689 Other specified bacterial agents as the cause of diseases classified elsewhere: Secondary | ICD-10-CM | POA: Insufficient documentation

## 2017-08-21 DIAGNOSIS — I341 Nonrheumatic mitral (valve) prolapse: Secondary | ICD-10-CM | POA: Diagnosis not present

## 2017-08-21 LAB — POCT URINALYSIS DIPSTICK
Appearance: NORMAL
Bilirubin, UA: NEGATIVE
Glucose, UA: NEGATIVE
Ketones, UA: NEGATIVE
Leukocytes, UA: NEGATIVE
Nitrite, UA: NEGATIVE
Odor: NORMAL
Spec Grav, UA: 1.015 (ref 1.010–1.025)
Urobilinogen, UA: 0.2 E.U./dL
pH, UA: 7.5 (ref 5.0–8.0)

## 2017-08-21 MED ORDER — CLINDAMYCIN PHOSPHATE 2 % VA CREA: 1.0000 | TOPICAL_CREAM | Freq: Every day | VAGINAL | 0 refills | Status: DC

## 2017-08-21 MED ORDER — VITAMIN D (ERGOCALCIFEROL) 1.25 MG (50000 UNIT) PO CAPS: 50000.0000 [IU] | ORAL_CAPSULE | ORAL | 2 refills | Status: DC

## 2017-08-21 NOTE — Patient Instructions (Addendum)
Cleocin vaginal cream prescribed. Take 50,000 units Drisdol weekly x 12 weeks then 2000 units daily. F/u one year. Have mammogram.  Remember to use SBE prophylaxis before procedures such as dental work.

## 2017-08-21 NOTE — Progress Notes (Signed)
Subjective:    Patient ID: Marissa Lewis, female    DOB: 1970-06-29, 47 y.o.   MRN: [id number]  HPI 47 year old Black  Female for health maintenance exam and evaluation of medical issues not seen since 2015  Seen and followed by Dr. Nadyne Coombes for late systolic murmur with midsystolic click diagnosed with moderate mitral valve prolapse on echo along with mild to moderate posteriorly directed mitral regurgitation.  SBE prophylaxis recommended by Dr. Einar Gip.  She is allergic to penicillin and clindamycin as recommended.  He refilled her clindamycin for dental procedures.  She is complaining of vaginal discharge consistent with bacterial vaginosis and will be treated with Cleocin vaginal cream.  Has occult blood on urine dipstick and culture will be sent.  Continues to have menstrual periods.  Was diagnosed by Dr. Chrissie Noa more with endometriosis.  Allergic to Penicillin-causes rash and hives.  Possibly allergic to sulfa.  History of urticaria August 2011.  Tonsillectomy and adenoidectomy 1980  Removal of right breast fibroadenoma in [facility] April 1989  10-year history of bilateral breast implants by Dr. Stephanie Coup  Has had hepatitis B series.  In 2008 was evaluated by Dr. Carlean Purl for elevated bilirubin and was diagnosed with Gilbert's.  In 2005 she had cholestatic jaundice thought to be due to oral contraceptives.  History of thoracolumbar scoliosis.  C-section x1.  Natural childbirth x1.  History of appendectomy with lysis of adhesions.  Family history: Father with history of lung cancer.  One brother and one sister.  Mother with history of diabetes.  Social history: 2 children.  Does not smoke or consume alcohol.  Review of Systems complaint of vaginal discharge.  Dr. Delsa Sale diagnosed her with endometriosis she says.  Has dysmenorrhea.     Objective:   Physical Exam  Constitutional: She is oriented to person, place, and time. She appears well-developed and  well-nourished. No distress.  HENT:  Head: Normocephalic and atraumatic.  Mouth/Throat: Oropharynx is clear and moist.  Bilateral cerumen in external ear canals-not removed.  TMs not seen.  Eyes: Pupils are equal, round, and reactive to light. Conjunctivae and EOM are normal. Right eye exhibits no discharge. Left eye exhibits no discharge.  Neck: Neck supple. No JVD present. No thyromegaly present.  Cardiovascular: Normal rate.  Late systolic click noted with systolic ejection murmur  Pulmonary/Chest: Effort normal and breath sounds normal. No respiratory distress. She has no rales.  Bilateral breast implants  Abdominal: She exhibits no distension and no mass. There is no tenderness. There is no rebound and no guarding.  Genitourinary: Vagina normal and uterus normal.  Genitourinary Comments: White vaginal discharge.  Wet prep not done.  Treat symptomatically.  Pap taken.  Musculoskeletal: She exhibits no edema.  Lymphadenopathy:    She has no cervical adenopathy.  Neurological: She is alert and oriented to person, place, and time. No cranial nerve deficit. Coordination normal.  Skin: Skin is warm and dry. No rash noted. She is not diaphoretic.  Psychiatric: She has a normal mood and affect. Her behavior is normal. Judgment and thought content normal.  Vitals reviewed.         Assessment & Plan:   Gilbert's phenomenon diagnosed by Dr. Carlean Purl  Bacterial vaginosis will be treated with Cleocin vaginal cream  Mitral regurgitation and mitral valve prolapse followed by Dr. Einar Gip  Dysmenorrhea  Vitamin D deficiency-take 50,000 units  Drisdol weekly for 12 weeks followed by 2000 units daily.  Bilateral breast implants  Per Dr. Nadyne Coombes, she is  she is a candidate for SBE prophylaxis  Return to clinic in 1 year or as needed.  Addendum: Abnormal urine dipstick noted.  Culture sent.  Results show Streptococcus agalactiae.This organism will not be treated.   Pap shows Candida  and  Diflucan was sent to pharmacy.

## 2017-08-23 ENCOUNTER — Other Ambulatory Visit: Payer: Self-pay

## 2017-08-23 LAB — CYTOLOGY - PAP: Diagnosis: NEGATIVE

## 2017-08-23 LAB — URINE CULTURE
MICRO NUMBER:: 90376074
SPECIMEN QUALITY:: ADEQUATE

## 2017-08-23 MED ORDER — FLUCONAZOLE 150 MG PO TABS: 150.0000 mg | ORAL_TABLET | Freq: Once | ORAL | 0 refills | Status: AC

## 2017-09-11 ENCOUNTER — Ambulatory Visit: Payer: Self-pay

## 2017-10-01 ENCOUNTER — Encounter (HOSPITAL_COMMUNITY): Payer: Self-pay | Admitting: Family Medicine

## 2017-10-01 ENCOUNTER — Ambulatory Visit (HOSPITAL_COMMUNITY)
Admission: EM | Admit: 2017-10-01 | Discharge: 2017-10-01 | Disposition: A | Discharge status: Home / Self Care | Payer: BLUE CROSS/BLUE SHIELD | Attending: Family Medicine | Admitting: Family Medicine

## 2017-10-01 ENCOUNTER — Ambulatory Visit (HOSPITAL_COMMUNITY): Payer: BLUE CROSS/BLUE SHIELD

## 2017-10-01 DIAGNOSIS — Z79899 Other long term (current) drug therapy: Secondary | ICD-10-CM | POA: Diagnosis not present

## 2017-10-01 DIAGNOSIS — N390 Urinary tract infection, site not specified: Secondary | ICD-10-CM | POA: Diagnosis not present

## 2017-10-01 DIAGNOSIS — Z882 Allergy status to sulfonamides status: Secondary | ICD-10-CM | POA: Insufficient documentation

## 2017-10-01 DIAGNOSIS — R109 Unspecified abdominal pain: Secondary | ICD-10-CM | POA: Diagnosis not present

## 2017-10-01 DIAGNOSIS — Z880 Allergy status to penicillin: Secondary | ICD-10-CM | POA: Diagnosis not present

## 2017-10-01 DIAGNOSIS — Z833 Family history of diabetes mellitus: Secondary | ICD-10-CM | POA: Insufficient documentation

## 2017-10-01 DIAGNOSIS — M549 Dorsalgia, unspecified: Secondary | ICD-10-CM | POA: Diagnosis not present

## 2017-10-01 LAB — POCT URINALYSIS DIP (DEVICE)
Bilirubin Urine: NEGATIVE
Glucose, UA: NEGATIVE mg/dL
Nitrite: NEGATIVE
Protein, ur: NEGATIVE mg/dL
Specific Gravity, Urine: >=1.03 (ref 1.005–1.030)
Urobilinogen, UA: 0.2 mg/dL (ref 0.0–1.0)
pH: 6 (ref 5.0–8.0)

## 2017-10-01 MED ORDER — KETOROLAC TROMETHAMINE 60 MG/2ML IM SOLN
INTRAMUSCULAR | Status: AC
  Filled 2017-10-01: qty 2

## 2017-10-01 MED ORDER — KETOROLAC TROMETHAMINE 60 MG/2ML IM SOLN
60.0000 mg | Freq: Once | INTRAMUSCULAR | Status: AC
  Administered 2017-10-01: 60 mg via INTRAMUSCULAR

## 2017-10-01 MED ORDER — FLUCONAZOLE 150 MG PO TABS: 150.0000 mg | ORAL_TABLET | Freq: Every day | ORAL | 0 refills | Status: DC

## 2017-10-01 MED ORDER — CEPHALEXIN 500 MG PO CAPS: 500.0000 mg | ORAL_CAPSULE | Freq: Two times a day (BID) | ORAL | 0 refills | Status: AC

## 2017-10-01 NOTE — ED Provider Notes (Signed)
[place]    CSN: [id number] Arrival date & time: 10/01/17  1810     History   Chief Complaint Chief Complaint  Patient presents with  . Back Pain    HPI Marissa Lewis is a 47 y.o. female.   shallon presents with complaints of burning pain to left flank which started yesterday after she got up from laying in bed. Felt a tight cramping sensation. It is to left mid back. Worse with activity. Took two aleve at 0900 today which did not help. Feels she needs to walk differently to decrease pain. Denies any urinary symptoms. Denies fevers. No numbness or tingling to extremities. Pain 7/10. Denies any previous similar. Better at rest. No blood to urine. Without nausea or vomiting. Hx of uti's, depression, mitral valve stenosis.    ROS per HPI.      Past Medical History:  Diagnosis Date  . Depression   . Gilbert's syndrome   . Mitral valve stenosis   . UTI (lower urinary tract infection)     Patient Active Problem List   Diagnosis Date Noted  . Complicated UTI (urinary tract infection) 08/24/2013  . Situational stress 03/03/2013  . Abnormal uterine bleeding 01/29/2013  . Anorexia nervosa 01/13/2013  . Bacterial vaginosis 09/10/2012  . Impacted cerumen of both ears 09/10/2012  . Abdominal pain, unspecified site 09/10/2012    Past Surgical History:  Procedure Laterality Date  . APPENDECTOMY    . CESAREAN SECTION    . TONSILLECTOMY    . UTERINE FIBROID SURGERY      OB History   None      Home Medications    Prior to Admission medications   Medication Sig Start Date End Date Taking? Authorizing Provider  cephALEXin (KEFLEX) 500 MG capsule Take 1 capsule (500 mg total) by mouth 2 (two) times daily for 10 days. 10/01/17 10/11/17  Zigmund Gottron, NP  clindamycin (CLEOCIN) 2 % vaginal cream Place 1 Applicatorful vaginally at bedtime. 08/21/17   Elby Showers, MD  clindamycin (CLEOCIN) 300 MG capsule Take 300 mg by mouth as needed (take 2 tabs po 30  minutes before a dental procedure.).     [provider]  Cranberry 180 MG CAPS Take by mouth.    [provider]  fluconazole (DIFLUCAN) 150 MG tablet Take 1 tablet (150 mg total) by mouth daily. 10/01/17   Augusto Gamble B, NP  naproxen sodium (ALEVE) 220 MG tablet Take 220 mg by mouth as needed.    [provider]  Vitamin D, Ergocalciferol, (DRISDOL) 50000 units CAPS capsule Take 1 capsule (50,000 Units total) by mouth every 7 (seven) days. 08/21/17   Elby Showers, MD    Family History Family History  Problem Relation Age of Onset  . Diabetes Mother   . Diabetes Father   . Cancer Maternal Grandmother        breast    Social History Social History   Tobacco Use  . Smoking status: Never Smoker  . Smokeless tobacco: Never Used  Substance Use Topics  . Alcohol use: Yes    Comment: occasional   . Drug use: No     Allergies   Penicillins and Sulfa antibiotics   Review of Systems Review of Systems   Physical Exam Triage Vital Signs ED Triage Vitals  Enc Vitals Group     BP 10/01/17 1855 117/78     Pulse Rate 10/01/17 1855 65     Resp 10/01/17 1855 18  Temp 10/01/17 1855 98.3 F (36.8 C)     Temp Source 10/01/17 1855 Oral     SpO2 10/01/17 1855 100 %     Weight --      Height --      Head Circumference --      Peak Flow --      Pain Score 10/01/17 1854 7     Pain Loc --      Pain Edu? --      Excl. in [place]? --    No data found.  Updated Vital Signs BP 117/78   Pulse 65   Temp 98.3 F (36.8 C) (Oral)   Resp 18   LMP 09/13/2017   SpO2 100%   Visual Acuity Right Eye Distance:   Left Eye Distance:   Bilateral Distance:    Right Eye Near:   Left Eye Near:    Bilateral Near:     Physical Exam  Constitutional: She is oriented to person, place, and time. She appears well-developed and well-nourished. No distress.  Eyes: Pupils are equal, round, and reactive to light.  Cardiovascular: Normal rate and regular rhythm.    Murmur heard. Pulmonary/Chest: Effort normal and breath sounds normal.  Abdominal: Bowel sounds are normal. There is no hepatosplenomegaly, splenomegaly or hepatomegaly. There is tenderness in the left upper quadrant. There is CVA tenderness. There is no rigidity, no rebound, no guarding, no tenderness at McBurney's point and negative Murphy's sign.  Musculoskeletal:       Thoracic back: She exhibits tenderness. She exhibits no bony tenderness and no pain.       Back:  Left mid back/flank pain with point tenderness; no rash present; without pain with hip flexion or straight leg raise bilaterally; pain with deep inspiration and with transitioning from sitting to laying and laying to sitting   Neurological: She is alert and oriented to person, place, and time.  Skin: Skin is warm and dry.     UC Treatments / Results  Labs (all labs ordered are listed, but only abnormal results are displayed) Labs Reviewed  POCT URINALYSIS DIP (DEVICE) - Abnormal; Notable for the following components:      Result Value   Ketones, ur TRACE (*)    Hgb urine dipstick MODERATE (*)    Leukocytes, UA TRACE (*)    All other components within normal limits  URINE CULTURE    EKG None  Radiology No results found.  Procedures Procedures (including critical care time)  Medications Ordered in UC Medications  ketorolac (TORADOL) injection 60 mg (60 mg Intramuscular Given 10/01/17 1943)    Initial Impression / Assessment and Plan / UC Course  I have reviewed the triage vital signs and the nursing notes.  Pertinent labs & imaging results that were available during my care of the patient were reviewed by me and considered in my medical decision making (see chart for details).     cva tenderness with leuks, hgb and ketones to urine. Hx of complicated UTIs. Will cover for pyelo with 10 days of keflex. Urine culture pending. Considered stones, discussed this with patient as well, nsaids encouraged and return  precautions provided. Patient verbalized understanding and agreeable to plan.   Final Clinical Impressions(s) / UC Diagnoses   Final diagnoses:  Flank pain  Urinary tract infection without hematuria, site unspecified     Discharge Instructions     Your urine results are concerning for urinary tract infection. I have started antibiotics that treat for bladder and  kidney infection. Increase your water intake to empty bladder regularly. Also be considered is kidney stone, which also causes flank pain. If no improvement or worsening of your pain please return to be seen or follow up with your primary care provider.  Aleve twice a day for pain, take next dose tomorrow morning. Take with food.     ED Prescriptions    Medication Sig Dispense Auth. Provider   cephALEXin (KEFLEX) 500 MG capsule Take 1 capsule (500 mg total) by mouth 2 (two) times daily for 10 days. 20 capsule Augusto Gamble B, NP   fluconazole (DIFLUCAN) 150 MG tablet Take 1 tablet (150 mg total) by mouth daily. 1 tablet Zigmund Gottron, NP     Controlled Substance Prescriptions [place] Controlled Substance Registry consulted? Not Applicable   Zigmund Gottron, NP 10/01/17 [phone]

## 2017-10-01 NOTE — ED Triage Notes (Signed)
Pt here for 2 days of back pain on the left side mid/lower. Reports that she took some naproxen with mild relief. Denies injury.

## 2017-10-01 NOTE — Discharge Instructions (Signed)
Your urine results are concerning for urinary tract infection. I have started antibiotics that treat for bladder and kidney infection. Increase your water intake to empty bladder regularly. Also be considered is kidney stone, which also causes flank pain. If no improvement or worsening of your pain please return to be seen or follow up with your primary care provider.  Aleve twice a day for pain, take next dose tomorrow morning. Take with food.

## 2017-10-03 LAB — URINE CULTURE

## 2017-11-15 ENCOUNTER — Other Ambulatory Visit: Payer: Self-pay | Admitting: Internal Medicine

## 2018-07-10 ENCOUNTER — Other Ambulatory Visit: Payer: Self-pay | Admitting: Cardiology

## 2018-07-10 DIAGNOSIS — I340 Nonrheumatic mitral (valve) insufficiency: Secondary | ICD-10-CM

## 2018-07-10 DIAGNOSIS — I341 Nonrheumatic mitral (valve) prolapse: Secondary | ICD-10-CM

## 2018-08-12 ENCOUNTER — Other Ambulatory Visit: Payer: Self-pay

## 2018-08-12 DIAGNOSIS — Z5329 Procedure and treatment not carried out because of patient's decision for other reasons: Secondary | ICD-10-CM

## 2018-08-18 NOTE — Progress Notes (Deleted)
Subjective:   Marissa Lewis, female    DOB: 12-Mar-1971, 48 y.o.   MRN: [id number]  Elby Showers, MD:  No chief complaint on file.   HPI: Marissa Lewis  is a 48 y.o. female  last seen by [place] 1 year ago for diastolic heart murmur. Echocardiogram in March 2018 revealed mitral valve prolapse with myoxmatus degeneration and mild to moderate mitral regurgitation. She underwent stress testing for atypical chest pain that was considered low risk. No further episodes of chest pain. Has chronic dyspnea on exertion that is mild and remains unchanged.  Reports recently resuming exercise and admits to not pushing herself. No history of hypertension or hyperlipidemia. There is no history of rheumatic heart disease. She does not smoke, has history of occasional alcohol use. Otherwise healthy. Family history negative for any sudden cardiac death or MI under the age of 62.  Admits to having episodes 2-3 times a month where she is awoken from a deep sleep feeling startled. No associated heart racing, chest pain, or dyspnea.   Past Medical History:  Diagnosis Date  . Depression   . Gilbert's syndrome   . Mitral valve stenosis   . UTI (lower urinary tract infection)     Past Surgical History:  Procedure Laterality Date  . APPENDECTOMY    . CESAREAN SECTION    . TONSILLECTOMY    . UTERINE FIBROID SURGERY      Family History  Problem Relation Age of Onset  . Diabetes Mother   . Diabetes Father   . Cancer Maternal Grandmother        breast    Social History   Socioeconomic History  . Marital status: Married    Spouse name: Not on file  . Number of children: Not on file  . Years of education: Not on file  . Highest education level: Not on file  Occupational History  . Not on file  Social Needs  . Financial resource strain: Not on file  . Food insecurity:    Worry: Not on file    Inability: Not on file  . Transportation needs:    Medical: Not on file   Non-medical: Not on file  Tobacco Use  . Smoking status: Never Smoker  . Smokeless tobacco: Never Used  Substance and Sexual Activity  . Alcohol use: Yes    Comment: occasional   . Drug use: No  . Sexual activity: Not on file  Lifestyle  . Physical activity:    Days per week: Not on file    Minutes per session: Not on file  . Stress: Not on file  Relationships  . Social connections:    Talks on phone: Not on file    Gets together: Not on file    Attends religious service: Not on file    Active member of club or organization: Not on file    Attends meetings of clubs or organizations: Not on file    Relationship status: Not on file  . Intimate partner violence:    Fear of current or ex partner: Not on file    Emotionally abused: Not on file    Physically abused: Not on file    Forced sexual activity: Not on file  Other Topics Concern  . Not on file  Social History Narrative  . Not on file    No outpatient medications have been marked as taking for the 08/19/18 encounter (Appointment) with Miquel Dunn, NP.  Review of Systems  Constitution: Negative for decreased appetite, malaise/fatigue, weight gain and weight loss.  Eyes: Negative for visual disturbance.  Cardiovascular: Positive for dyspnea on exertion. Negative for chest pain, claudication, leg swelling, orthopnea, palpitations and syncope.  Respiratory: Negative for hemoptysis and wheezing.   Endocrine: Negative for cold intolerance and heat intolerance.  Hematologic/Lymphatic: Does not bruise/bleed easily.  Skin: Negative for nail changes.  Musculoskeletal: Negative for muscle weakness and myalgias.  Gastrointestinal: Negative for abdominal pain, change in bowel habit, nausea and vomiting.  Neurological: Negative for difficulty with concentration, dizziness, focal weakness and headaches.  Psychiatric/Behavioral: Negative for altered mental status and suicidal ideas.  All other systems reviewed and are  negative.      Objective:     There were no vitals taken for this visit.  Cardiac studies:  EKG 08/14/2017: Marked sinus bradycardia at 48 bpm, left atrial abnormality, normal axis, poor R-wave progression cannot exclude anterior infarct old.  Diffuse nonspecific T-wave abnormality.  No change from EKG except for marked bradycardia compared to EKG 07/19/2016.  Echocardiogram 08/08/2016: Left ventricle cavity is normal in size. Normal global wall motion. Normal diastolic filling pattern, normal LAP. Calculated EF 69%. Left atrial cavity is normal in size. 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 Moderate mitral valve prolapse anterior more than the posterior with moderate myxomatous degeneration. Mild to Moderate (Grade II) posteriorly directed mitral regurgitation.  Treadmill stress test [08/11/2016]: Indication: Chest pain Resting EKG demonstrates NSR. The patient exercised according to Bruce Protocol, Total time recorded 6:23 min achieving max heart rate of 179 which was 101% of THR for age and 7.61 METS of work. Stress terminated due to fatigue and THR (>85% MPHR)/MPHR met. Normal BP response. There was no ST-T changes of ischemia with exercise stress test. There were no significant arrhythmias. Normal BP response. Rec: No e/o ischemia by GXT. Exercise tolerence is low normal for age .Continue Preventive therapy.  Recent Labs:  ***   CMP Latest Ref Rng & Units 08/14/2017  Glucose 65 - 99 mg/dL 88  BUN 7 - 25 mg/dL 10  Creatinine 0.50 - 1.10 mg/dL 0.87  Sodium 135 - 146 mmol/L 139  Potassium 3.5 - 5.3 mmol/L 3.9  Chloride 98 - 110 mmol/L 108  CO2 20 - 32 mmol/L 27  Calcium 8.6 - 10.2 mg/dL 8.9  Total Protein 6.1 - 8.1 g/dL 6.7  Total Bilirubin 0.2 - 1.2 mg/dL 2.1(H)  Alkaline Phos 39 - 117 U/L -  AST 10 - 30 U/L 13  ALT 6 - 29 U/L 11   Lipid Panel     Component Value Date/Time   CHOL 188 08/14/2017 0929   TRIG 60 08/14/2017 0929   HDL 69  08/14/2017 0929   CHOLHDL 2.7 08/14/2017 0929   VLDL 9 09/09/2012 0936   LDLCALC 104 (H) 08/14/2017 0929   CBC Latest Ref Rng & Units 08/14/2017  WBC 3.8 - 10.8 Thousand/uL 5.7  Hemoglobin 11.7 - 15.5 g/dL 13.1  Hematocrit 35.0 - 45.0 % 38.7  Platelets 140 - 400 Thousand/uL 266    Lab Results  Component Value Date   TSH 0.55 08/14/2017     Physical Exam  Constitutional: She appears well-developed and well-nourished. No distress.  HENT:  Head: Atraumatic.  Eyes: Conjunctivae are normal.  Neck: Neck supple. No JVD present. No thyromegaly present.  Cardiovascular: Normal rate, regular rhythm and intact distal pulses. Exam reveals a midsystolic click. Exam reveals no gallop.  Murmur heard. High-pitched  mid to late systolic murmur is present with a grade of 2/6. Pulmonary/Chest: Effort normal and breath sounds normal.  Abdominal: Soft. Bowel sounds are normal.  Musculoskeletal: Normal range of motion.        General: No edema.  Neurological: She is alert.  Skin: Skin is warm and dry.  Psychiatric: She has a normal mood and affect.           Assessment & Recommendations:  There are no diagnoses linked to this encounter.   Patient presents for 1 year follow-up for mitral valve prolapse and mild to moderate mitral regurgitation. She is presently doing well, has not had any episodes of chest pain. She does have dyspnea on exertion with extreme exertion, but does admit to not pushing herself. She has recently resumed regular exercise, and I feel her dyspnea on exertion is likely related to inactivity. Advised her to try increasing her exercise tolerance and if she continues to have dyspnea on exertion to notify us. She will continue to need endocarditis prophylaxis with clindamycin and I have refilled her prescription. No changes noted to mitral murmur by physical exam. Will repeat echocardiogram in one year for surveillance. Does continue to have sinus bradycardia that is  asymptomatic. She is to follow-up with her PCP next week for annual physical. Advised her to discuss her symptoms of occasional waking him on night feeling startled as I do not suspect cardiac etiology. I'll see her back in one year or sooner if problems.   Jeri Lager, MSN, APRN, FNP-C [facility], [place] Office: [phone] Fax: [phone]

## 2018-08-19 ENCOUNTER — Ambulatory Visit: Payer: Self-pay | Admitting: Cardiology

## 2018-09-05 ENCOUNTER — Other Ambulatory Visit: Payer: Self-pay

## 2018-09-05 ENCOUNTER — Ambulatory Visit (INDEPENDENT_AMBULATORY_CARE_PROVIDER_SITE_OTHER): Payer: BLUE CROSS/BLUE SHIELD

## 2018-09-05 ENCOUNTER — Encounter: Payer: Self-pay | Admitting: Internal Medicine

## 2018-09-05 DIAGNOSIS — I341 Nonrheumatic mitral (valve) prolapse: Secondary | ICD-10-CM | POA: Diagnosis not present

## 2018-09-05 DIAGNOSIS — I340 Nonrheumatic mitral (valve) insufficiency: Secondary | ICD-10-CM

## 2018-09-11 ENCOUNTER — Other Ambulatory Visit: Payer: Self-pay

## 2018-09-11 ENCOUNTER — Ambulatory Visit: Payer: BLUE CROSS/BLUE SHIELD | Admitting: Cardiology

## 2018-09-11 ENCOUNTER — Encounter: Payer: Self-pay | Admitting: Cardiology

## 2018-09-11 VITALS — BP 139/84 | HR 74 | Ht 69.0 in | Wt 167.0 lb

## 2018-09-11 DIAGNOSIS — I340 Nonrheumatic mitral (valve) insufficiency: Secondary | ICD-10-CM | POA: Diagnosis not present

## 2018-09-11 DIAGNOSIS — R0609 Other forms of dyspnea: Secondary | ICD-10-CM

## 2018-09-11 DIAGNOSIS — Z298 Encounter for other specified prophylactic measures: Secondary | ICD-10-CM | POA: Diagnosis not present

## 2018-09-11 DIAGNOSIS — R0600 Dyspnea, unspecified: Secondary | ICD-10-CM

## 2018-09-11 MED ORDER — CLINDAMYCIN HCL 300 MG PO CAPS: 300.0000 mg | ORAL_CAPSULE | ORAL | 3 refills | Status: DC | PRN

## 2018-09-11 NOTE — Progress Notes (Signed)
Subjective:   Marissa Lewis, female    DOB: 28-Nov-1970, 48 y.o.   MRN: [id number]  Marissa Showers, MD:  Chief Complaint  Patient presents with   Mitral Valve Prolapse   Results    Echo   Follow-up    24yr  This visit type was conducted due to national recommendations for restrictions regarding the COVID-19 Pandemic (e.g. social distancing).  This format is felt to be most appropriate for this patient at this time.  All issues noted in this document were discussed and addressed.  No physical exam was performed (except for noted visual exam findings with Telehealth visits).  The patient has consented to conduct a Telehealth visit and understands insurance will be billed.   I discussed the limitations of evaluation and management by telemedicine and the availability of in person appointments. The patient expressed understanding and agreed to proceed.  Virtual Visit via Video Note is as below  I connected with Marissa Lewis, on 09/11/18 at 1515 by a video enabled telemedicine application and verified that I am speaking with the correct person using two identifiers.     I have discussed with her regarding the safety during COVID Pandemic and steps and precautions including social distancing with the patient.    HPI: Marissa Lewis is a 48y.o. female  with MVP and mitral regurgitation and Gilbert's disease.  Last seen by [email or web address] ago for mitral regurgitation. She underwent echocardiogram on 09/06/2018 that did not reveal any changes compared to previous echo in 2018. Continues to have moderate MR. This is virtual visit for annual follow up.  Patient reports that she is doing well, without any complaints. States that she has gained about 10 lbs since last seen by [place] She has atypical chest pain and has underwent stress testing in march 2018 that was considered low risk. She only notices chest pain if she gets to thinking about her previous episodes of chest pain and attributes  this to anxiety. Not related to exertion.Has chronic dyspnea on exertion that is mild and remains unchanged. Admits to not exercising regularly.  No history of hypertension or hyperlipidemia. There is no history of rheumatic heart disease. She does not smoke, has history of occasional alcohol use. Otherwise healthy. Family history negative for any sudden cardiac death or MI under the age of 69   Past Medical History:  Diagnosis Date   Depression    Gilbert's syndrome    Mitral valve stenosis    UTI (lower urinary tract infection)     Past Surgical History:  Procedure Laterality Date   APPENDECTOMY     CESAREAN SECTION     TONSILLECTOMY     UTERINE FIBROID SURGERY      Family History  Problem Relation Age of Onset   Diabetes Mother    Diabetes Father    Cancer Maternal Grandmother        breast    Social History   Socioeconomic History   Marital status: Married    Spouse name: Not on file   Number of children: 2   Years of education: Not on file   Highest education level: Not on file  Occupational History   Not on file  Social Needs   Financial resource strain: Not on file   Food insecurity:    Worry: Not on file    Inability: Not on file   Transportation needs:    Medical: Not on file  Non-medical: Not on file  Tobacco Use   Smoking status: Never Smoker   Smokeless tobacco: Never Used  Substance and Sexual Activity   Alcohol use: Yes    Comment: occasional    Drug use: No   Sexual activity: Not on file  Lifestyle   Physical activity:    Days per week: Not on file    Minutes per session: Not on file   Stress: Not on file  Relationships   Social connections:    Talks on phone: Not on file    Gets together: Not on file    Attends religious service: Not on file    Active member of club or organization: Not on file    Attends meetings of clubs or organizations: Not on file    Relationship status: Not on file   Intimate  partner violence:    Fear of current or ex partner: Not on file    Emotionally abused: Not on file    Physically abused: Not on file    Forced sexual activity: Not on file  Other Topics Concern   Not on file  Social History Narrative   Not on file    Current Meds  Medication Sig   clindamycin (CLEOCIN) 300 MG capsule Take 300 mg by mouth as needed (take 2 tabs po 30 minutes before a dental procedure.).    Cranberry 180 MG CAPS Take by mouth.   Lactobacillus (AZO COMPLETE FEMININE BALANCE PO) Take by mouth daily.   naproxen sodium (ALEVE) 220 MG tablet Take 220 mg by mouth as needed.     Review of Systems  Constitution: Negative for decreased appetite, malaise/fatigue, weight gain and weight loss.  Eyes: Negative for visual disturbance.  Cardiovascular: Positive for chest pain and dyspnea on exertion (mild). Negative for claudication, leg swelling, orthopnea, palpitations and syncope.  Respiratory: Negative for hemoptysis and wheezing.   Endocrine: Negative for cold intolerance and heat intolerance.  Hematologic/Lymphatic: Does not bruise/bleed easily.  Skin: Negative for nail changes.  Musculoskeletal: Negative for muscle weakness and myalgias.  Gastrointestinal: Negative for abdominal pain, change in bowel habit, nausea and vomiting.  Neurological: Negative for difficulty with concentration, dizziness, focal weakness and headaches.  Psychiatric/Behavioral: Negative for altered mental status and suicidal ideas.  All other systems reviewed and are negative.      Objective:     Blood pressure 139/84, pulse 74, height '5\' 9"'$  (1.753 m), weight 167 lb (75.8 kg).  Cardiac studies:  Echocardiogram 09/05/2018: Left ventricle cavity is normal in size. Normal global wall motion. Calculated EF 60%. 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 Moderate eccentric posteriorly directed  mitral regurgitation. Inadequate TR jet to  estimate pulmonary artery systolic pressure. Normal right atrial pressure. No significant change compared to previous study on 08/08/2016.  EKG 08/14/2017: Marked sinus bradycardia at 48 bpm, left atrial abnormality, normal axis, poor R-wave progression cannot exclude anterior infarct old.  Diffuse nonspecific T-wave abnormality.  No change from EKG except for marked bradycardia compared to EKG 07/19/2016.  Treadmill exercise stress test 08/11/2016: Indication: Chest pain Resting EKG demonstrates NSR. The patient exercised according to Bruce Protocol, Total time  recorded  6:23 min achieving max heart rate of   179 which was 101% of THR for age and 7.61 METS of work.  Stress terminated due to  fatigue and THR (>85% MPHR)/MPHR met. Normal BP response. There was no ST-T changes of ischemia with exercise stress test. There were no significant arrhythmias.  Normal BP response.  Rec: No e/o ischemia by GXT. Exercise tolerence is  low normal for age .Continue Preventive therapy.   Recent Labs: CMP     Component Value Date/Time   NA 139 08/14/2017 0929   K 3.9 08/14/2017 0929   CL 108 08/14/2017 0929   CO2 27 08/14/2017 0929   GLUCOSE 88 08/14/2017 0929   BUN 10 08/14/2017 0929   CREATININE 0.87 08/14/2017 0929   CALCIUM 8.9 08/14/2017 0929   PROT 6.7 08/14/2017 0929   ALBUMIN 4.3 09/09/2012 0936   AST 13 08/14/2017 0929   ALT 11 08/14/2017 0929   ALKPHOS 32 (L) 09/09/2012 0936   BILITOT 2.1 (H) 08/14/2017 0929   GFRNONAA 81 08/14/2017 0929   GFRAA 94 08/14/2017 0929   Lipid Panel     Component Value Date/Time   CHOL 188 08/14/2017 0929   TRIG 60 08/14/2017 0929   HDL 69 08/14/2017 0929   CHOLHDL 2.7 08/14/2017 0929   VLDL 9 09/09/2012 0936   LDLCALC 104 (H) 08/14/2017 0929   CBC    Component Value Date/Time   WBC 5.7 08/14/2017 0929   RBC 4.30 08/14/2017 0929   HGB 13.1 08/14/2017 0929   HCT 38.7 08/14/2017 0929   PLT 266 08/14/2017 0929   MCV 90.0 08/14/2017 0929   MCH  30.5 08/14/2017 0929   MCHC 33.9 08/14/2017 0929   RDW 12.4 08/14/2017 0929   LYMPHSABS 1,949 08/14/2017 0929   MONOABS 0.6 04/13/2014 1239   EOSABS 91 08/14/2017 0929   BASOSABS 51 08/14/2017 0929     Physical Exam  Constitutional: She is oriented to person, place, and time. She appears well-developed and well-nourished. No distress.  HENT:  Head: Normocephalic.  Neck: No JVD present. No thyromegaly present.  Pulmonary/Chest: Effort normal. No respiratory distress.  Neurological: She is alert and oriented to person, place, and time.  Psychiatric: She has a normal mood and affect. Her behavior is normal.        Assessment & Recommendations:  1. Moderate mitral regurgitation Unchanged from previous echo in 2018. Suspect her dyspnea on exertion is related to deconditioning. I have advised her on concerning symptoms that could suggest worsening valvular disorder and encouraged her to contact me for any concerns. Will continue with annual echocardiograms for surveillance.   2. SBE (subacute bacterial endocarditis) prophylaxis candidate Has severe myxomatous degeneration and MVP and will continue to need endocarditis prophylaxis prior to dental procedures. I have refilled her Clindamycin.  3. Dyspnea on exertion Mild and unchanged. Suspect is related to inactivity. I have asked her to start regular exercise such as walking 20-30 mins daily and hopefully this will improve.    Plan: I have reviewed her labs from PCP office, which are stable. She will see her PCP in May for annual visit. As she has not had any changes by echocardiogram and symptomatically is stable, I will see her back in 1 year after her repeat echocardiogram for surveillance. Encouraged her to contact me sooner for any concerns.    Jeri Lager, MSN, APRN, FNP-C [facility], [place] Office: [phone] Fax: [phone]

## 2019-01-03 ENCOUNTER — Other Ambulatory Visit: Payer: Self-pay

## 2019-01-03 ENCOUNTER — Encounter: Payer: Self-pay | Admitting: Internal Medicine

## 2019-01-03 ENCOUNTER — Ambulatory Visit (INDEPENDENT_AMBULATORY_CARE_PROVIDER_SITE_OTHER): Payer: BC Managed Care – PPO | Admitting: Internal Medicine

## 2019-01-03 VITALS — BP 130/80 | HR 78 | Temp 98.2°F | Ht 69.0 in | Wt 166.0 lb

## 2019-01-03 DIAGNOSIS — N898 Other specified noninflammatory disorders of vagina: Secondary | ICD-10-CM | POA: Diagnosis not present

## 2019-01-03 LAB — POCT WET PREP (WET MOUNT)

## 2019-01-03 MED ORDER — FLUCONAZOLE 150 MG PO TABS
ORAL_TABLET | ORAL | 1 refills | Status: DC
Start: 2019-01-03 — End: 2019-03-25

## 2019-01-25 NOTE — Patient Instructions (Signed)
Diflucan 150 mg tablet and repeat in 3 days.  1 refill ordered on this prescription.  Use vinegar and water douche after second dose of Diflucan.

## 2019-01-25 NOTE — Progress Notes (Signed)
   Subjective:    Patient ID: Marissa Lewis, female    DOB: 1971-01-08, 48 y.o.   MRN: [id number]  HPI 48 year old Female complaining of thick vaginal discharge. Somewhat itchy. Thinks she may have ueast infection. Has had this previously. Is in stable relationship.    Review of Systems see above     Objective:   Physical Exam  Thick white vaginal discharge.  Wet prep consistent with budding yeast.      Assessment & Plan:  Candida vaginitis  Plan: Diflucan 1 p.o. today and repeat in 3 days.  1 refill ordered on this prescription.  Use vinegar and water douche after second dose of Diflucan.

## 2019-03-21 ENCOUNTER — Other Ambulatory Visit: Payer: BC Managed Care – PPO | Admitting: Internal Medicine

## 2019-03-21 ENCOUNTER — Other Ambulatory Visit: Payer: Self-pay

## 2019-03-21 DIAGNOSIS — E7800 Pure hypercholesterolemia, unspecified: Secondary | ICD-10-CM

## 2019-03-21 DIAGNOSIS — Z0000 Encounter for general adult medical examination without abnormal findings: Secondary | ICD-10-CM | POA: Diagnosis not present

## 2019-03-21 DIAGNOSIS — Z13220 Encounter for screening for lipoid disorders: Secondary | ICD-10-CM | POA: Diagnosis not present

## 2019-03-21 DIAGNOSIS — E559 Vitamin D deficiency, unspecified: Secondary | ICD-10-CM | POA: Diagnosis not present

## 2019-03-21 LAB — COMPLETE METABOLIC PANEL WITH GFR
AG Ratio: 1.6 (calc) (ref 1.0–2.5)
ALT: 9 U/L (ref 6–29)
AST: 11 U/L (ref 10–35)
Albumin: 4.4 g/dL (ref 3.6–5.1)
Alkaline phosphatase (APISO): 42 U/L (ref 31–125)
BUN: 11 mg/dL (ref 7–25)
CO2: 26 mmol/L (ref 20–32)
Calcium: 9 mg/dL (ref 8.6–10.2)
Chloride: 105 mmol/L (ref 98–110)
Creat: 0.99 mg/dL (ref 0.50–1.10)
GFR, Est African American: 79 mL/min/{1.73_m2} (ref >=60)
GFR, Est Non African American: 68 mL/min/{1.73_m2} (ref >=60)
Globulin: 2.8 g/dL (calc) (ref 1.9–3.7)
Glucose, Bld: 97 mg/dL (ref 65–99)
Potassium: 4 mmol/L (ref 3.5–5.3)
Sodium: 140 mmol/L (ref 135–146)
Total Bilirubin: 2 mg/dL — ABNORMAL HIGH (ref 0.2–1.2)
Total Protein: 7.2 g/dL (ref 6.1–8.1)

## 2019-03-21 LAB — CBC WITH DIFFERENTIAL/PLATELET
Absolute Monocytes: 323 cells/uL (ref 200–950)
Basophils Absolute: 42 cells/uL (ref 0–200)
Basophils Relative: 0.8 %
Eosinophils Absolute: 101 cells/uL (ref 15–500)
Eosinophils Relative: 1.9 %
HCT: 38.1 % (ref 35.0–45.0)
Hemoglobin: 12.9 g/dL (ref 11.7–15.5)
Lymphs Abs: 2210 cells/uL (ref 850–3900)
MCH: 30.4 pg (ref 27.0–33.0)
MCHC: 33.9 g/dL (ref 32.0–36.0)
MCV: 89.9 fL (ref 80.0–100.0)
MPV: 10.2 fL (ref 7.5–12.5)
Monocytes Relative: 6.1 %
Neutro Abs: 2624 cells/uL (ref 1500–7800)
Neutrophils Relative %: 49.5 %
Platelets: 299 10*3/uL (ref 140–400)
RBC: 4.24 10*6/uL (ref 3.80–5.10)
RDW: 12.9 % (ref 11.0–15.0)
Total Lymphocyte: 41.7 %
WBC: 5.3 10*3/uL (ref 3.8–10.8)

## 2019-03-21 LAB — LIPID PANEL
Cholesterol: 201 mg/dL — ABNORMAL HIGH (ref <200)
HDL: 61 mg/dL (ref >=50)
LDL Cholesterol (Calc): 125 mg/dL (calc) — ABNORMAL HIGH
Non-HDL Cholesterol (Calc): 140 mg/dL (calc) — ABNORMAL HIGH (ref <130)
Total CHOL/HDL Ratio: 3.3 (calc) (ref <5.0)
Triglycerides: 62 mg/dL (ref <150)

## 2019-03-21 LAB — VITAMIN D 25 HYDROXY (VIT D DEFICIENCY, FRACTURES): Vit D, 25-Hydroxy: 14 ng/mL — ABNORMAL LOW (ref 30–100)

## 2019-03-21 LAB — TSH: TSH: 0.54 mIU/L

## 2019-03-24 ENCOUNTER — Other Ambulatory Visit: Payer: Self-pay

## 2019-03-24 ENCOUNTER — Other Ambulatory Visit: Payer: BC Managed Care – PPO | Admitting: Internal Medicine

## 2019-03-24 DIAGNOSIS — R17 Unspecified jaundice: Secondary | ICD-10-CM

## 2019-03-25 ENCOUNTER — Ambulatory Visit (INDEPENDENT_AMBULATORY_CARE_PROVIDER_SITE_OTHER): Payer: BC Managed Care – PPO | Admitting: Internal Medicine

## 2019-03-25 ENCOUNTER — Encounter: Payer: Self-pay | Admitting: Internal Medicine

## 2019-03-25 ENCOUNTER — Other Ambulatory Visit: Payer: Self-pay

## 2019-03-25 VITALS — BP 100/70 | HR 62 | Temp 98.0°F | Ht 69.0 in | Wt 168.0 lb

## 2019-03-25 DIAGNOSIS — Z298 Encounter for other specified prophylactic measures: Secondary | ICD-10-CM

## 2019-03-25 DIAGNOSIS — E7800 Pure hypercholesterolemia, unspecified: Secondary | ICD-10-CM

## 2019-03-25 DIAGNOSIS — Z23 Encounter for immunization: Secondary | ICD-10-CM | POA: Diagnosis not present

## 2019-03-25 DIAGNOSIS — Z0000 Encounter for general adult medical examination without abnormal findings: Secondary | ICD-10-CM

## 2019-03-25 DIAGNOSIS — E559 Vitamin D deficiency, unspecified: Secondary | ICD-10-CM

## 2019-03-25 DIAGNOSIS — I340 Nonrheumatic mitral (valve) insufficiency: Secondary | ICD-10-CM

## 2019-03-25 DIAGNOSIS — Z1231 Encounter for screening mammogram for malignant neoplasm of breast: Secondary | ICD-10-CM

## 2019-03-25 DIAGNOSIS — I341 Nonrheumatic mitral (valve) prolapse: Secondary | ICD-10-CM

## 2019-03-25 LAB — POCT URINALYSIS DIPSTICK
Appearance: NEGATIVE
Bilirubin, UA: NEGATIVE
Glucose, UA: NEGATIVE
Ketones, UA: NEGATIVE
Leukocytes, UA: NEGATIVE
Nitrite, UA: NEGATIVE
Odor: NEGATIVE
Protein, UA: NEGATIVE
Spec Grav, UA: 1.015 (ref 1.010–1.025)
Urobilinogen, UA: 0.2 E.U./dL
pH, UA: 6.5 (ref 5.0–8.0)

## 2019-03-25 MED ORDER — ERGOCALCIFEROL 1.25 MG (50000 UT) PO CAPS: 50000.0000 [IU] | ORAL_CAPSULE | ORAL | 3 refills | Status: DC

## 2019-03-25 NOTE — Progress Notes (Signed)
   Subjective:    Patient ID: Marissa Lewis, female    DOB: Mar 15, 1971, 48 y.o.   MRN: [id number]  HPI 48 year old Female for health maintenance exam and evaluation of medical issues. To have flu vaccine today.  Hx MVP and MR seen bt Dr. Nadyne Coombes. SBE prophylaxis recommended by him.  She is allergic to penicillin and takes clindamycin for dental procedures.  Possibly allergic to sulfa.  History of urticaria August 2011.  Tonsillectomy and adenoidectomy 1980.  Removal of right breast fibroadenoma [facility] 1999  History of bilateral breast implants by Dr. Stephanie Coup.  C-section x1.  Natural childbirth x1.  History of appendectomy with lysis of adhesions.  Has had Hepatitis B series.  In 2008 was evaluated by Dr. 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  In 2005 she had cholestatic jaundice thought to be due to oral contraceptives.  History of thoracolumbar scoliosis.  History of bacterial vaginosis from time to time treated with Cleocin vaginal cream.  Occasional urinary tract infections.  Family Hx: Father died ijn June with complications of Covid 19 and sarcoidosis.  1 brother and 1 sister.  Mother with history of diabetes.  Patient says she has had surgery by Dr. Delsa Sale for endometriosis in the remote past perhaps around 2004    Social Hx: Works for [employer]. Married.  2 sons.    Review of Systems Has noted interval between menses has shortened to 3 weeks. Having mastalgia around time of menses.  May be perimenopausal.  Will refer to GYN physician. Order placed for mammogram.     Objective:   Physical Exam Blood pressure 100/70, pulse 62 temperature 98 degrees orally pulse oximetry 98% weight 168 pounds.  BMI 24.81.  Skin warm and dry.  Nodes none.  TMs are clear.  Neck is supple without thyromegaly.  Chest clear.  Cardiac regular rate and rhythm.  2/6 systolic ejection murmur.  Abdomen soft nondistended without  hepatosplenomegaly masses or tenderness.  Pelvic exam deferred to GYN physician.  No lower extremity edema.  Neuro no focal deficits on brief neurological exam.  Affect thought judgment normal.       Assessment & Plan:  Decreased interval between menstrual periods and mastalgia.  Referred to GYN physician.  History of mitral mitral valve prolapse followed by Dr. Einar Gip and is SBE prophylaxis candidate per Dr. Einar Gip  History of elevated serum bilirubin evaluated by Dr. Carlean Purl and found to have [place] syndrome.  Vitamin D deficiency will be placed on vitamin D 50,000 units weekly  History of bacterial vaginosis and occasional urinary tract infection  Bilateral breast implants today's mammogram ordered  Elevated LDL cholesterol-work on diet and exercise.  LDL increased from 104 and 2019 to 125 at present.  Plan: Return in 1 year or as needed.

## 2019-03-25 NOTE — Patient Instructions (Signed)
Please make an appointment with GYN. Have mammogram. Take  50,000 Units Vitamin D weekly. Watch diet with elevated.

## 2019-04-14 ENCOUNTER — Other Ambulatory Visit: Payer: Self-pay

## 2019-04-14 ENCOUNTER — Ambulatory Visit (INDEPENDENT_AMBULATORY_CARE_PROVIDER_SITE_OTHER): Payer: BC Managed Care – PPO | Admitting: Obstetrics and Gynecology

## 2019-04-14 ENCOUNTER — Encounter: Payer: Self-pay | Admitting: Obstetrics and Gynecology

## 2019-04-14 VITALS — Ht 68.0 in | Wt 169.0 lb

## 2019-04-14 DIAGNOSIS — N959 Unspecified menopausal and perimenopausal disorder: Secondary | ICD-10-CM | POA: Diagnosis not present

## 2019-04-14 NOTE — Progress Notes (Signed)
48 yo P2 presenting today for evaluation of abnormal menstrual cycle. Patient reports a history of a normal every 28 day cycle with vaginal bleeding for 5 day. She states that over the past 3 months, her cycles occur every 21-22 days still lasting 5 days and preceded by breast tenderness. Patient is otherwise without complaints. She denies any pelvic pain or abnormal discharge. She is sexually active without complaints. She denies any abnormal vaginal discharge. She denies any urinary incontinence or issues with bowel movements  Past Medical History:  Diagnosis Date  . Depression   . Gilbert's syndrome   . Mitral valve stenosis   . UTI (lower urinary tract infection)    Past Surgical History:  Procedure Laterality Date  . APPENDECTOMY    . CESAREAN SECTION    . TONSILLECTOMY    . UTERINE FIBROID SURGERY     Family History  Problem Relation Age of Onset  . Diabetes Mother   . Diabetes Father   . Cancer Maternal Grandmother        breast   Social History   Tobacco Use  . Smoking status: Never Smoker  . Smokeless tobacco: Never Used  Substance Use Topics  . Alcohol use: Not Currently    Comment: occasional   . Drug use: No   ROS See pertinent in HPI  Height 5\' 8"  (1.727 m), weight 169 lb (76.7 kg), last menstrual period 03/25/2019. GENERAL: Well-developed, well-nourished female in no acute distress.  HEENT: Normocephalic, atraumatic. Sclerae anicteric.  NECK: Supple. Normal thyroid.  BREASTS: Symmetric in size. No palpable masses or lymphadenopathy, skin changes, or nipple drainage. ABDOMEN: Soft, nontender, nondistended. No organomegaly. PELVIC: Normal external female genitalia. Vagina is pink and rugated.  Normal discharge. Normal appearing cervix. Uterus is normal in size. No adnexal mass or tenderness. EXTREMITIES: No cyanosis, clubbing, or edema, 2+ distal pulses.  A/P 48 yo with shorter menstrual cycle - Reassurance provided given that patient is entering  pre-menopausal phase - Patient with normal pap smear in 2019 - Patient scheduled for mammogram in December - RTC in 1 year or prn

## 2019-04-30 ENCOUNTER — Encounter: Payer: Self-pay | Admitting: Internal Medicine

## 2019-04-30 ENCOUNTER — Telehealth (INDEPENDENT_AMBULATORY_CARE_PROVIDER_SITE_OTHER): Payer: BC Managed Care – PPO | Admitting: Internal Medicine

## 2019-04-30 DIAGNOSIS — B373 Candidiasis of vulva and vagina: Secondary | ICD-10-CM | POA: Diagnosis not present

## 2019-04-30 DIAGNOSIS — B3731 Acute candidiasis of vulva and vagina: Secondary | ICD-10-CM

## 2019-04-30 MED ORDER — FLUCONAZOLE 150 MG PO TABS: ORAL_TABLET | ORAL | 1 refills | Status: DC

## 2019-04-30 NOTE — Telephone Encounter (Signed)
Scheduled phone visit.

## 2019-04-30 NOTE — Telephone Encounter (Signed)
Set up phone call °

## 2019-04-30 NOTE — Telephone Encounter (Signed)
Patient called complaining of vaginal discharge with same symptoms as she had 3 months ago which was treated with Diflucan and improved. Complains of odor but no itching. Has to have dental visit next week and has to take SBE prophylaxis then.  3 months ago had visit here with yeast noted on wet prep. Did complain of odor then but not felt to have bacterial vaginosis and improved with Diflucan.  I am going to go ahead and treat her with Diflucan today and asked that she repeat  dose on Monday when she goes for dental visit.  We will make sure she has a refill on Diflucan for the future.  I personally spoke with patient on the phone, took history, medical decision making including time spent reviewing records.

## 2019-04-30 NOTE — Telephone Encounter (Signed)
Marissa Lewis [phone]  [name] called to say that on Monday she notice discharge and this morning she now has a fishy like smell. She would like to get a refill on Diflucan or does she need to come in first.. She also said she is still having dental procedures where she is taken antibiotics and the next procedure is on 05/06/19.

## 2019-05-16 ENCOUNTER — Ambulatory Visit
Admission: RE | Admit: 2019-05-16 | Discharge: 2019-05-16 | Disposition: A | Discharge status: Home / Self Care | Payer: BC Managed Care – PPO | Source: Ambulatory Visit | Attending: Internal Medicine | Admitting: Internal Medicine

## 2019-05-16 ENCOUNTER — Other Ambulatory Visit: Payer: Self-pay

## 2019-05-16 DIAGNOSIS — Z1231 Encounter for screening mammogram for malignant neoplasm of breast: Secondary | ICD-10-CM

## 2019-05-16 DIAGNOSIS — Z0000 Encounter for general adult medical examination without abnormal findings: Secondary | ICD-10-CM

## 2019-07-07 ENCOUNTER — Telehealth: Payer: Self-pay | Admitting: Internal Medicine

## 2019-07-07 NOTE — Telephone Encounter (Signed)
Marissa Lewis [phone]  Ayushi called to say for the last couple of months she has been having constipation and at times when she wipes she sees blood.. No COVID exposure, no symptoms

## 2019-07-07 NOTE — Telephone Encounter (Signed)
She should try Miralax daily and see if constipation gets better. If not, we can see her. The blood is likely from trying to pass hard stool.

## 2019-07-07 NOTE — Telephone Encounter (Signed)
Called and let patient know what Dr Renold Genta said, she verbalized understanding and will try that and call back if not better.

## 2019-09-08 ENCOUNTER — Other Ambulatory Visit: Payer: BLUE CROSS/BLUE SHIELD

## 2019-09-11 ENCOUNTER — Other Ambulatory Visit: Payer: Self-pay

## 2019-09-11 ENCOUNTER — Ambulatory Visit: Payer: BC Managed Care – PPO

## 2019-09-11 DIAGNOSIS — I340 Nonrheumatic mitral (valve) insufficiency: Secondary | ICD-10-CM | POA: Diagnosis not present

## 2019-09-15 ENCOUNTER — Ambulatory Visit: Payer: BLUE CROSS/BLUE SHIELD | Admitting: Cardiology

## 2019-09-18 ENCOUNTER — Ambulatory Visit: Payer: BC Managed Care – PPO | Admitting: Cardiology

## 2019-09-18 ENCOUNTER — Other Ambulatory Visit: Payer: Self-pay

## 2019-09-18 ENCOUNTER — Other Ambulatory Visit: Payer: Self-pay | Admitting: Cardiology

## 2019-09-18 ENCOUNTER — Encounter: Payer: Self-pay | Admitting: Cardiology

## 2019-09-18 VITALS — BP 121/80 | HR 71 | Temp 97.9°F | Resp 17 | Ht 69.0 in | Wt 174.0 lb

## 2019-09-18 DIAGNOSIS — I340 Nonrheumatic mitral (valve) insufficiency: Secondary | ICD-10-CM

## 2019-09-18 DIAGNOSIS — I341 Nonrheumatic mitral (valve) prolapse: Secondary | ICD-10-CM

## 2019-09-18 NOTE — H&P (View-Only) (Signed)
Marissa Lewis Date of Birth: Jun 03, 1970 MRN: [id number] Primary Care Provider:Baxley, Cresenciano Lick, MD Primary Cardiologist: Rex Kras, DO (established care 09/18/2019)  Date: 09/18/19 Last Office Visit: 09/11/2018  Chief Complaint  Patient presents with  . Mitral Regurgitation  . Follow-up    1 year  . Results    echo    HPI  Marissa Lewis is a 49 y.o.  female who presents to the office with a chief complaint of "1 year follow-up on mitral regurgitation."   Patient was last seen in the office on 09/11/2018 by Jeri Lager, APRN, FNP-C.  At last office visit patient was doing well from a clinical standpoint was recommended to follow-up in 1 year after echocardiogram.  Since last office visit patient states that she been doing well.  She denies any chest pain or shortness of breath.  She goes to the gym regularly and has not noticed any significant change in her endurance.  Echocardiogram shows preserved left ventricular systolic function with grade 3 mitral regurgitation per report.  The images of the study were reviewed with the patient in great detail. 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   Denies prior history of coronary artery disease, myocardial infarction, congestive heart failure, deep venous thrombosis, pulmonary embolism, stroke, transient ischemic attack.  FUNCTIONAL STATUS: Goes to the gym three times a week (mostly weights and cardio for 15 minutes).   ALLERGIES: Allergies  Allergen Reactions  . Penicillins Rash  . Sulfa Antibiotics Hives and Rash    MEDICATION LIST PRIOR TO VISIT: Current Outpatient Medications on File Prior to Visit  Medication Sig Dispense Refill  . Ascorbic Acid (VITAMIN C ER PO) Take 1 tablet by mouth daily.    . Biotin 1 MG CAPS Take by mouth.    . ergocalciferol (VITAMIN D2) 1.25 MG  (50000 UT) capsule Take 1 capsule (50,000 Units total) by mouth once a week. 12 capsule 3  . fluconazole (DIFLUCAN) 150 MG tablet Take one dose now and repeat dose with SBE prophylaxis med next week. 2 tablet 1  . Multiple Vitamin (MULTIVITAMIN) capsule Take 1 capsule by mouth daily.    . Zinc 50 MG CAPS Take 1 capsule by mouth daily.     No current facility-administered medications on file prior to visit.    PAST MEDICAL HISTORY: Past Medical History:  Diagnosis Date  . Depression   . Gilbert's syndrome   . Mitral valve stenosis   . UTI (lower urinary tract infection)     PAST SURGICAL HISTORY: Past Surgical History:  Procedure Laterality Date  . APPENDECTOMY    . CESAREAN SECTION    . TONSILLECTOMY    . UTERINE FIBROID SURGERY      FAMILY HISTORY: The patient's family history includes Cancer in her maternal grandmother; Diabetes in her father, mother, and sister; Hyperlipidemia in her brother and mother; Lung cancer in her father.   SOCIAL HISTORY:  The patient  reports that she has never smoked. She has never used smokeless tobacco. She reports current alcohol use. She reports that she does not use drugs.  Review of Systems  Constitution: Negative for chills and fever.  HENT: Negative for ear discharge, ear pain and nosebleeds.   Eyes: Negative for blurred vision and discharge.  Cardiovascular: Negative for chest pain, claudication, dyspnea on exertion, leg swelling, near-syncope, orthopnea, palpitations, paroxysmal nocturnal  dyspnea and syncope.  Respiratory: Negative for cough and shortness of breath.   Endocrine: Negative for polydipsia, polyphagia and polyuria.  Hematologic/Lymphatic: Negative for bleeding problem.  Skin: Negative for flushing and nail changes.  Musculoskeletal: Negative for muscle cramps, muscle weakness and myalgias.  Gastrointestinal: Negative for abdominal pain, dysphagia, hematemesis, hematochezia, melena, nausea and vomiting.  Neurological:  Negative for dizziness, focal weakness and light-headedness.   PHYSICAL EXAM: Vitals with BMI 09/18/2019 04/14/2019 03/25/2019  Height '5\' 9"'$  '5\' 8"'$  '5\' 9"'$   Weight 174 lbs 169 lbs 168 lbs  BMI 25.68 24.4 01.0  Systolic 272 - 536  Diastolic 80 - 70  Pulse 71 - 62    CONSTITUTIONAL: Well-developed and well-nourished. No acute distress.  SKIN: Skin is warm and dry. No rash noted. No cyanosis. No pallor. No jaundice HEAD: Normocephalic and atraumatic.  EYES: No scleral icterus MOUTH/THROAT: Moist oral membranes.  NECK: No JVD present. No thyromegaly noted. No carotid bruits  LYMPHATIC: No visible cervical adenopathy.  CHEST Normal respiratory effort. No intercostal retractions  LUNGS: Clear to auscultation bilaterally. No stridor. No wheezes. No rales.  CARDIOVASCULAR: Regular rate and rhythm, positive S1-S2, no murmurs rubs or gallops appreciated. ABDOMINAL: No apparent ascites.  EXTREMITIES: No peripheral edema  HEMATOLOGIC: No significant bruising. NEUROLOGIC: Oriented to person, place, and time. Nonfocal. Normal muscle tone.  PSYCHIATRIC: Normal mood and affect. Normal behavior. Cooperative  CARDIAC DATABASE: EKG: 08/14/2017: Marked sinus bradycardia at 48 bpm, left atrial abnormality, normal axis, poor R-wave progression cannot exclude anterior infarct old.  Diffuse nonspecific T-wave abnormality.  09/18/2019: Sinus bradycardia, 50 bpm, poor R wave progression, nonspecific T wave abnormality, low voltage, cannot exclude anterior infarct old, no underlying injury pattern.  Echocardiogram: 09/05/2018: LVEF 60%. 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 Moderate eccentric posteriorly directed mitral regurgitation. Normal right atrial pressure.  09/11/2019: Normal LV systolic function with EF 60%. Normal diastolic filling pattern. Myxomatous degeneration with bileaflet prolapse. Moderate (Grade III) posteriorly directed mitral regurgitation.  Mild tricuspid regurgitation.  No evidence of pulmonary hypertension.   Stress Testing:  Treadmill exercise stress test 08/11/2016: The patient exercised according to Bruce Protocol, Total time recorded 6:23 min, achieving 101% of THR for age and 7.61 METS of work. Stress terminated due to fatigue and THR (>85% MPHR)/MPHR met. Normal BP response. There was no ST-T changes of ischemia with exercise stress test. There were no significant arrhythmias. Normal BP response. No e/o ischemia by GXT. Exercise tolerence is low normal for age .Continue Preventive therapy.  Heart Catheterization: None  LABORATORY DATA: CBC Latest Ref Rng & Units 03/21/2019 08/14/2017 04/13/2014  WBC 3.8 - 10.8 Thousand/uL 5.3 5.7 14.3(H)  Hemoglobin 11.7 - 15.5 g/dL 12.9 13.1 12.6  Hematocrit 35.0 - 45.0 % 38.1 38.7 41.1  Platelets 140 - 400 Thousand/uL 299 266 198    CMP Latest Ref Rng & Units 03/21/2019 08/14/2017 09/09/2012  Glucose 65 - 99 mg/dL 97 88 84  BUN 7 - 25 mg/dL '11 10 14  '$ Creatinine 0.50 - 1.10 mg/dL 0.99 0.87 0.83  Sodium 135 - 146 mmol/L 140 139 139  Potassium 3.5 - 5.3 mmol/L 4.0 3.9 3.8  Chloride 98 - 110 mmol/L 105 108 104  CO2 20 - 32 mmol/L '26 27 25  '$ Calcium 8.6 - 10.2 mg/dL 9.0 8.9 9.1  Total Protein 6.1 - 8.1 g/dL 7.2 6.7 6.4  Total Bilirubin 0.2 - 1.2 mg/dL 2.0(H) 2.1(H) 2.2(H)  Alkaline Phos 39 - 117 U/L - - 32(L)  AST 10 - 35 U/L '11 13 12  '$ ALT 6 - 29 U/L '9 11 8    '$ Lipid Panel     Component Value Date/Time   CHOL 201 (H) 03/21/2019 1000   TRIG 62 03/21/2019 1000   HDL 61 03/21/2019 1000   CHOLHDL 3.3 03/21/2019 1000   VLDL 9 09/09/2012 0936   LDLCALC 125 (H) 03/21/2019 1000    No results found for: HGBA1C No components found for: NTPROBNP Lab Results  Component Value Date   TSH 0.54 03/21/2019   TSH 0.55 08/14/2017   TSH 0.610 09/09/2012    Cardiac Panel (last 3 results) No results for input(s): CKTOTAL, CKMB, TROPONINIHS, RELINDX in the last 72 hours.  FINAL  MEDICATION LIST END OF ENCOUNTER: No orders of the defined types were placed in this encounter.   There are no discontinued medications.   Current Outpatient Medications:  .  Ascorbic Acid (VITAMIN C ER PO), Take 1 tablet by mouth daily., Disp: , Rfl:  .  Biotin 1 MG CAPS, Take by mouth., Disp: , Rfl:  .  ergocalciferol (VITAMIN D2) 1.25 MG (50000 UT) capsule, Take 1 capsule (50,000 Units total) by mouth once a week., Disp: 12 capsule, Rfl: 3 .  fluconazole (DIFLUCAN) 150 MG tablet, Take one dose now and repeat dose with SBE prophylaxis med next week., Disp: 2 tablet, Rfl: 1 .  Multiple Vitamin (MULTIVITAMIN) capsule, Take 1 capsule by mouth daily., Disp: , Rfl:  .  Zinc 50 MG CAPS, Take 1 capsule by mouth daily., Disp: , Rfl:   IMPRESSION:    ICD-10-CM   1. Moderate mitral regurgitation  I34.0 EKG [id number]    Basic metabolic panel    Magnesium    SARS-COV-2 RNA,(COVID-19) QUAL NAAT    CBC  2. Mitral valve prolapse  I34.1      RECOMMENDATIONS: SANIKA BROSIOUS is a 49 y.o. 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  Symptomatically patient is doing well.  Surface echocardiogram images were reviewed with the patient. She has myxomatous mitral valve with bileaflet prolapse and at least moderate mitral regurgitation. Cannot rule out flail mitral valve leaflet or chordae.  Discussed repeating TTE in 6 months or undergoing TEE now to evaluate the mitral valve. Patient would like to have it evaluated sooner.   Recommended transesophageal echocardiogram with anesthesia.  The indications, alternatives, risks, and benefits of transesophageal echocardiogram (TEE) were reviewed.  Complications  including but not limited to esophageal perforation (rare), gastrointestinal bleeding (rare), cardiac arrhythmia which can include cardiac arrest and death (rare), pharyngeal irritation / discomfort with swallowing / hematoma, methemoglobinemia, bronchospasm, transient hypoxia, nonsustained ventricular tachycardia, transient atrial fibrillation, minimal hemoptysis, vomiting, hypotension, respiratory compromise, reaction to medications, unavoidable damage to teeth and gums, aspiration pneumonia  were reviewed with the patient.  Patient voices understands and wishes to proceed with the procedure. Patient's questions and concerns were addressed to their satisfaction.  Bilateral mitral valve prolapse: See above  Patient does not complain of any symptoms of palpitations.  Review of her labs from October 2020 illustrates elevated cholesterol levels. Patient states that she is working on lifestyle modifications and has any yearly physical coming up with her PCP who will be rechecking it. Will defer further management to PCP.  Orders Placed  This Encounter  Procedures  . SARS-COV-2 RNA,(COVID-19) QUAL NAAT  . Basic metabolic panel  . Magnesium  . CBC  . EKG 12-Lead   --Continue cardiac medications as reconciled in final medication list. --Return for post-Tee. Or sooner if needed. --Continue follow-up with your primary care physician regarding the management of your other chronic comorbid conditions.  Patient's questions and concerns were addressed to her satisfaction. She voices understanding of the instructions provided during this encounter.   Total encounter time 45 minutes. Total Encounter Time as defined by the Centers for Medicare and Medicaid Services includes, in addition to the face-to-face time of a patient visit (documented in the note above) non-face-to-face time: obtaining and reviewing outside history, ordering and reviewing medications, tests or procedures, care coordination  (communications with other health care professionals or caregivers) and documentation in the medical record.  This note was created using a voice recognition software as a result there may be grammatical errors inadvertently enclosed that do not reflect the nature of this encounter. Every attempt is made to correct such errors.  Rex Kras, [place], [facility]  Pager: [phone] Office: [phone]

## 2019-09-18 NOTE — Progress Notes (Signed)
Marissa Lewis Date of Birth: [birth year] MRN: [id number] Primary Care Provider:Baxley, Cresenciano Lick, MD Primary Cardiologist: Rex Kras, DO (established care 09/18/2019)  Date: 09/18/19 Last Office Visit: 09/11/2018  Chief Complaint  Patient presents with  . Mitral Regurgitation  . Follow-up    1 year  . Results    echo    HPI  Marissa Lewis is a 49 y.o.  female who presents to the office with a chief complaint of "1 year follow-up on mitral regurgitation."   Patient was last seen in the office on 09/11/2018 by Jeri Lager, APRN, FNP-C.  At last office visit patient was doing well from a clinical standpoint was recommended to follow-up in 1 year after echocardiogram.  Since last office visit patient states that she been doing well.  She denies any chest pain or shortness of breath.  She goes to the gym regularly and has not noticed any significant change in her endurance.  Echocardiogram shows preserved left ventricular systolic function with grade 3 mitral regurgitation per report.  The images of the study were reviewed with the patient in great detail. 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   Denies prior history of coronary artery disease, myocardial infarction, congestive heart failure, deep venous thrombosis, pulmonary embolism, stroke, transient ischemic attack.  FUNCTIONAL STATUS: Goes to the gym three times a week (mostly weights and cardio for 15 minutes).   ALLERGIES: Allergies  Allergen Reactions  . Penicillins Rash  . Sulfa Antibiotics Hives and Rash    MEDICATION LIST PRIOR TO VISIT: Current Outpatient Medications on File Prior to Visit  Medication Sig Dispense Refill  . Ascorbic Acid (VITAMIN C ER PO) Take 1 tablet by mouth daily.    . Biotin 1 MG CAPS Take by mouth.    . ergocalciferol (VITAMIN D2) 1.25 MG  (50000 UT) capsule Take 1 capsule (50,000 Units total) by mouth once a week. 12 capsule 3  . fluconazole (DIFLUCAN) 150 MG tablet Take one dose now and repeat dose with SBE prophylaxis med next week. 2 tablet 1  . Multiple Vitamin (MULTIVITAMIN) capsule Take 1 capsule by mouth daily.    . Zinc 50 MG CAPS Take 1 capsule by mouth daily.     No current facility-administered medications on file prior to visit.    PAST MEDICAL HISTORY: Past Medical History:  Diagnosis Date  . Depression   . Gilbert's syndrome   . Mitral valve stenosis   . UTI (lower urinary tract infection)     PAST SURGICAL HISTORY: Past Surgical History:  Procedure Laterality Date  . APPENDECTOMY    . CESAREAN SECTION    . TONSILLECTOMY    . UTERINE FIBROID SURGERY      FAMILY HISTORY: The patient's family history includes Cancer in her maternal grandmother; Diabetes in her father, mother, and sister; Hyperlipidemia in her brother and mother; Lung cancer in her father.   SOCIAL HISTORY:  The patient  reports that she has never smoked. She has never used smokeless tobacco. She reports current alcohol use. She reports that she does not use drugs.  Review of Systems  Constitution: Negative for chills and fever.  HENT: Negative for ear discharge, ear pain and nosebleeds.   Eyes: Negative for blurred vision and discharge.  Cardiovascular: Negative for chest pain, claudication, dyspnea on exertion, leg swelling, near-syncope, orthopnea, palpitations, paroxysmal nocturnal  dyspnea and syncope.  Respiratory: Negative for cough and shortness of breath.   Endocrine: Negative for polydipsia, polyphagia and polyuria.  Hematologic/Lymphatic: Negative for bleeding problem.  Skin: Negative for flushing and nail changes.  Musculoskeletal: Negative for muscle cramps, muscle weakness and myalgias.  Gastrointestinal: Negative for abdominal pain, dysphagia, hematemesis, hematochezia, melena, nausea and vomiting.  Neurological:  Negative for dizziness, focal weakness and light-headedness.   PHYSICAL EXAM: Vitals with BMI 09/18/2019 04/14/2019 03/25/2019  Height '5\' 9"'$  '5\' 8"'$  '5\' 9"'$   Weight 174 lbs 169 lbs 168 lbs  BMI 25.68 94.1 74.0  Systolic 814 - 481  Diastolic 80 - 70  Pulse 71 - 62    CONSTITUTIONAL: Well-developed and well-nourished. No acute distress.  SKIN: Skin is warm and dry. No rash noted. No cyanosis. No pallor. No jaundice HEAD: Normocephalic and atraumatic.  EYES: No scleral icterus MOUTH/THROAT: Moist oral membranes.  NECK: No JVD present. No thyromegaly noted. No carotid bruits  LYMPHATIC: No visible cervical adenopathy.  CHEST Normal respiratory effort. No intercostal retractions  LUNGS: Clear to auscultation bilaterally. No stridor. No wheezes. No rales.  CARDIOVASCULAR: Regular rate and rhythm, positive S1-S2, no murmurs rubs or gallops appreciated. ABDOMINAL: No apparent ascites.  EXTREMITIES: No peripheral edema  HEMATOLOGIC: No significant bruising. NEUROLOGIC: Oriented to person, place, and time. Nonfocal. Normal muscle tone.  PSYCHIATRIC: Normal mood and affect. Normal behavior. Cooperative  CARDIAC DATABASE: EKG: 08/14/2017: Marked sinus bradycardia at 48 bpm, left atrial abnormality, normal axis, poor R-wave progression cannot exclude anterior infarct old.  Diffuse nonspecific T-wave abnormality.  09/18/2019: Sinus bradycardia, 50 bpm, poor R wave progression, nonspecific T wave abnormality, low voltage, cannot exclude anterior infarct old, no underlying injury pattern.  Echocardiogram: 09/05/2018: LVEF 60%. 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 Moderate eccentric posteriorly directed mitral regurgitation. Normal right atrial pressure.  09/11/2019: Normal LV systolic function with EF 60%. Normal diastolic filling pattern. Myxomatous degeneration with bileaflet prolapse. Moderate (Grade III) posteriorly directed mitral regurgitation.  Mild tricuspid regurgitation.  No evidence of pulmonary hypertension.   Stress Testing:  Treadmill exercise stress test 08/11/2016: The patient exercised according to Bruce Protocol, Total time recorded 6:23 min, achieving 101% of THR for age and 7.61 METS of work. Stress terminated due to fatigue and THR (>85% MPHR)/MPHR met. Normal BP response. There was no ST-T changes of ischemia with exercise stress test. There were no significant arrhythmias. Normal BP response. No e/o ischemia by GXT. Exercise tolerence is low normal for age .Continue Preventive therapy.  Heart Catheterization: None  LABORATORY DATA: CBC Latest Ref Rng & Units 03/21/2019 08/14/2017 04/13/2014  WBC 3.8 - 10.8 Thousand/uL 5.3 5.7 14.3(H)  Hemoglobin 11.7 - 15.5 g/dL 12.9 13.1 12.6  Hematocrit 35.0 - 45.0 % 38.1 38.7 41.1  Platelets 140 - 400 Thousand/uL 299 266 198    CMP Latest Ref Rng & Units 03/21/2019 08/14/2017 09/09/2012  Glucose 65 - 99 mg/dL 97 88 84  BUN 7 - 25 mg/dL '11 10 14  '$ Creatinine 0.50 - 1.10 mg/dL 0.99 0.87 0.83  Sodium 135 - 146 mmol/L 140 139 139  Potassium 3.5 - 5.3 mmol/L 4.0 3.9 3.8  Chloride 98 - 110 mmol/L 105 108 104  CO2 20 - 32 mmol/L '26 27 25  '$ Calcium 8.6 - 10.2 mg/dL 9.0 8.9 9.1  Total Protein 6.1 - 8.1 g/dL 7.2 6.7 6.4  Total Bilirubin 0.2 - 1.2 mg/dL 2.0(H) 2.1(H) 2.2(H)  Alkaline Phos 39 - 117 U/L - - 32(L)  AST 10 - 35 U/L '11 13 12  '$ ALT 6 - 29 U/L '9 11 8    '$ Lipid Panel     Component Value Date/Time   CHOL 201 (H) 03/21/2019 1000   TRIG 62 03/21/2019 1000   HDL 61 03/21/2019 1000   CHOLHDL 3.3 03/21/2019 1000   VLDL 9 09/09/2012 0936   LDLCALC 125 (H) 03/21/2019 1000    No results found for: HGBA1C No components found for: NTPROBNP Lab Results  Component Value Date   TSH 0.54 03/21/2019   TSH 0.55 08/14/2017   TSH 0.610 09/09/2012    Cardiac Panel (last 3 results) No results for input(s): CKTOTAL, CKMB, TROPONINIHS, RELINDX in the last 72 hours.  FINAL  MEDICATION LIST END OF ENCOUNTER: No orders of the defined types were placed in this encounter.   There are no discontinued medications.   Current Outpatient Medications:  .  Ascorbic Acid (VITAMIN C ER PO), Take 1 tablet by mouth daily., Disp: , Rfl:  .  Biotin 1 MG CAPS, Take by mouth., Disp: , Rfl:  .  ergocalciferol (VITAMIN D2) 1.25 MG (50000 UT) capsule, Take 1 capsule (50,000 Units total) by mouth once a week., Disp: 12 capsule, Rfl: 3 .  fluconazole (DIFLUCAN) 150 MG tablet, Take one dose now and repeat dose with SBE prophylaxis med next week., Disp: 2 tablet, Rfl: 1 .  Multiple Vitamin (MULTIVITAMIN) capsule, Take 1 capsule by mouth daily., Disp: , Rfl:  .  Zinc 50 MG CAPS, Take 1 capsule by mouth daily., Disp: , Rfl:   IMPRESSION:    ICD-10-CM   1. Moderate mitral regurgitation  I34.0 EKG [id number]    Basic metabolic panel    Magnesium    SARS-COV-2 RNA,(COVID-19) QUAL NAAT    CBC  2. Mitral valve prolapse  I34.1      RECOMMENDATIONS: Marissa Lewis is a 49 y.o. 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  Symptomatically patient is doing well.  Surface echocardiogram images were reviewed with the patient. She has myxomatous mitral valve with bileaflet prolapse and at least moderate mitral regurgitation. Cannot rule out flail mitral valve leaflet or chordae.  Discussed repeating TTE in 6 months or undergoing TEE now to evaluate the mitral valve. Patient would like to have it evaluated sooner.   Recommended transesophageal echocardiogram with anesthesia.  The indications, alternatives, risks, and benefits of transesophageal echocardiogram (TEE) were reviewed.  Complications  including but not limited to esophageal perforation (rare), gastrointestinal bleeding (rare), cardiac arrhythmia which can include cardiac arrest and death (rare), pharyngeal irritation / discomfort with swallowing / hematoma, methemoglobinemia, bronchospasm, transient hypoxia, nonsustained ventricular tachycardia, transient atrial fibrillation, minimal hemoptysis, vomiting, hypotension, respiratory compromise, reaction to medications, unavoidable damage to teeth and gums, aspiration pneumonia  were reviewed with the patient.  Patient voices understands and wishes to proceed with the procedure. Patient's questions and concerns were addressed to their satisfaction.  Bilateral mitral valve prolapse: See above  Patient does not complain of any symptoms of palpitations.  Review of her labs from October 2020 illustrates elevated cholesterol levels. Patient states that she is working on lifestyle modifications and has any yearly physical coming up with her PCP who will be rechecking it. Will defer further management to PCP.  Orders Placed  This Encounter  Procedures  . SARS-COV-2 RNA,(COVID-19) QUAL NAAT  . Basic metabolic panel  . Magnesium  . CBC  . EKG 12-Lead   --Continue cardiac medications as reconciled in final medication list. --Return for post-Tee. Or sooner if needed. --Continue follow-up with your primary care physician regarding the management of your other chronic comorbid conditions.  Patient's questions and concerns were addressed to her satisfaction. She voices understanding of the instructions provided during this encounter.   Total encounter time 45 minutes. Total Encounter Time as defined by the Centers for Medicare and Medicaid Services includes, in addition to the face-to-face time of a patient visit (documented in the note above) non-face-to-face time: obtaining and reviewing outside history, ordering and reviewing medications, tests or procedures, care coordination  (communications with other health care professionals or caregivers) and documentation in the medical record.  This note was created using a voice recognition software as a result there may be grammatical errors inadvertently enclosed that do not reflect the nature of this encounter. Every attempt is made to correct such errors.  Rex Kras, [place], [facility]  Pager: [phone] Office: [phone]

## 2019-09-19 ENCOUNTER — Other Ambulatory Visit (HOSPITAL_COMMUNITY)
Admission: RE | Admit: 2019-09-19 | Discharge: 2019-09-19 | Disposition: A | Discharge status: Home / Self Care | Payer: BC Managed Care – PPO | Source: Ambulatory Visit | Attending: Cardiology | Admitting: Cardiology

## 2019-09-19 DIAGNOSIS — Z20822 Contact with and (suspected) exposure to covid-19: Secondary | ICD-10-CM | POA: Insufficient documentation

## 2019-09-19 DIAGNOSIS — Z01812 Encounter for preprocedural laboratory examination: Secondary | ICD-10-CM | POA: Insufficient documentation

## 2019-09-20 LAB — SARS CORONAVIRUS 2 (TAT 6-24 HRS): SARS Coronavirus 2: NEGATIVE

## 2019-09-23 ENCOUNTER — Encounter (HOSPITAL_COMMUNITY)
Admission: RE | Disposition: A | Discharge status: Home / Self Care | Payer: Self-pay | Source: Home / Self Care | Attending: Cardiology

## 2019-09-23 ENCOUNTER — Other Ambulatory Visit: Payer: Self-pay

## 2019-09-23 ENCOUNTER — Ambulatory Visit (HOSPITAL_COMMUNITY): Payer: BC Managed Care – PPO | Admitting: Certified Registered Nurse Anesthetist

## 2019-09-23 ENCOUNTER — Encounter (HOSPITAL_COMMUNITY): Payer: Self-pay | Admitting: Cardiology

## 2019-09-23 ENCOUNTER — Other Ambulatory Visit (HOSPITAL_COMMUNITY): Payer: Self-pay | Admitting: Cardiology

## 2019-09-23 ENCOUNTER — Ambulatory Visit (HOSPITAL_COMMUNITY)
Admission: RE | Admit: 2019-09-23 | Discharge: 2019-09-23 | Disposition: A | Discharge status: Home / Self Care | Payer: BC Managed Care – PPO | Attending: Cardiology | Admitting: Cardiology

## 2019-09-23 ENCOUNTER — Ambulatory Visit (HOSPITAL_COMMUNITY): Payer: BC Managed Care – PPO

## 2019-09-23 DIAGNOSIS — Z880 Allergy status to penicillin: Secondary | ICD-10-CM | POA: Diagnosis not present

## 2019-09-23 DIAGNOSIS — Z882 Allergy status to sulfonamides status: Secondary | ICD-10-CM | POA: Diagnosis not present

## 2019-09-23 DIAGNOSIS — I340 Nonrheumatic mitral (valve) insufficiency: Secondary | ICD-10-CM | POA: Diagnosis not present

## 2019-09-23 DIAGNOSIS — I341 Nonrheumatic mitral (valve) prolapse: Secondary | ICD-10-CM

## 2019-09-23 DIAGNOSIS — R7881 Bacteremia: Secondary | ICD-10-CM

## 2019-09-23 DIAGNOSIS — I083 Combined rheumatic disorders of mitral, aortic and tricuspid valves: Secondary | ICD-10-CM | POA: Insufficient documentation

## 2019-09-23 HISTORY — DX: Nonrheumatic mitral (valve) insufficiency: I34.0

## 2019-09-23 HISTORY — PX: TEE WITHOUT CARDIOVERSION: SHX5443

## 2019-09-23 HISTORY — PX: BUBBLE STUDY: SHX6837

## 2019-09-23 SURGERY — ECHOCARDIOGRAM, TRANSESOPHAGEAL
Anesthesia: Monitor Anesthesia Care

## 2019-09-23 MED ORDER — PROPOFOL 500 MG/50ML IV EMUL
INTRAVENOUS | Status: DC | PRN
  Administered 2019-09-23: 175 ug/kg/min via INTRAVENOUS

## 2019-09-23 MED ORDER — LIDOCAINE 2% (20 MG/ML) 5 ML SYRINGE
INTRAMUSCULAR | Status: DC | PRN
  Administered 2019-09-23: 40 mg via INTRAVENOUS

## 2019-09-23 MED ORDER — PROPOFOL 10 MG/ML IV BOLUS
INTRAVENOUS | Status: DC | PRN
  Administered 2019-09-23: 100 mg via INTRAVENOUS

## 2019-09-23 MED ORDER — PROPOFOL 10 MG/ML IV BOLUS: INTRAVENOUS | Status: DC | PRN

## 2019-09-23 MED ORDER — BUTAMBEN-TETRACAINE-BENZOCAINE 2-2-14 % EX AERO
INHALATION_SPRAY | CUTANEOUS | Status: DC | PRN
  Administered 2019-09-23: 2 via TOPICAL

## 2019-09-23 MED ORDER — MIDAZOLAM HCL (PF) 5 MG/ML IJ SOLN
INTRAMUSCULAR | Status: AC
  Filled 2019-09-23: qty 2

## 2019-09-23 MED ORDER — DEXMEDETOMIDINE HCL IN NACL 200 MCG/50ML IV SOLN
INTRAVENOUS | Status: DC | PRN
  Administered 2019-09-23: 12 ug via INTRAVENOUS

## 2019-09-23 MED ORDER — SODIUM CHLORIDE 0.9 % IV SOLN: INTRAVENOUS | Status: DC

## 2019-09-23 MED ORDER — FENTANYL CITRATE (PF) 100 MCG/2ML IJ SOLN
INTRAMUSCULAR | Status: AC
  Filled 2019-09-23: qty 2

## 2019-09-23 MED ORDER — SODIUM CHLORIDE 0.9 % IV SOLN
INTRAVENOUS | Status: DC | PRN
  Administered 2019-09-23: 500 mL via INTRAVENOUS

## 2019-09-23 MED ORDER — DIPHENHYDRAMINE HCL 50 MG/ML IJ SOLN
INTRAMUSCULAR | Status: AC
  Filled 2019-09-23: qty 1

## 2019-09-23 MED ORDER — LACTATED RINGERS IV SOLN: INTRAVENOUS | Status: DC | PRN

## 2019-09-23 NOTE — Anesthesia Preprocedure Evaluation (Signed)
Anesthesia Evaluation  Patient identified by MRN, date of birth, ID band Patient awake    Reviewed: Allergy & Precautions, NPO status , Patient's Chart, lab work & pertinent test results  Airway Mallampati: II  TM Distance: >3 FB     Dental   Pulmonary    breath sounds clear to auscultation       Cardiovascular  Rhythm:Regular Rate:Normal  History noted CG   Neuro/Psych    GI/Hepatic negative GI ROS, Neg liver ROS,   Endo/Other    Renal/GU negative Renal ROS     Musculoskeletal   Abdominal   Peds  Hematology   Anesthesia Other Findings   Reproductive/Obstetrics                             Anesthesia Physical Anesthesia Plan  ASA: III  Anesthesia Plan: General   Post-op Pain Management:    Induction: Intravenous  PONV Risk Score and Plan: 3 and Propofol infusion  Airway Management Planned: Nasal Cannula and Nasal CPAP  Additional Equipment:   Intra-op Plan:   Post-operative Plan:   Informed Consent: I have reviewed the patients History and Physical, chart, labs and discussed the procedure including the risks, benefits and alternatives for the proposed anesthesia with the patient or authorized representative who has indicated his/her understanding and acceptance.     Dental advisory given  Plan Discussed with: CRNA and Anesthesiologist  Anesthesia Plan Comments:         Anesthesia Quick Evaluation

## 2019-09-23 NOTE — Anesthesia Procedure Notes (Signed)
Procedure Name: MAC Date/Time: 09/23/2019 11:45 AM Performed by: Leonor Liv, CRNA Pre-anesthesia Checklist: Patient identified, Emergency Drugs available, Suction available and Patient being monitored Patient Re-evaluated:Patient Re-evaluated prior to induction Oxygen Delivery Method: Nasal cannula Preoxygenation: Pre-oxygenation with 100% oxygen Placement Confirmation: positive ETCO2 Dental Injury: Teeth and Oropharynx as per pre-operative assessment

## 2019-09-23 NOTE — Discharge Instructions (Signed)
Transesophageal Echocardiogram Transesophageal echocardiogram (TEE) is a test that uses sound waves to take pictures of your heart. TEE is done by passing a flexible tube down the esophagus. The esophagus is the tube that carries food from the throat to the stomach. The pictures give detailed images of your heart. This can help your doctor see if there are problems with your heart. What happens before the procedure? Staying hydrated Follow instructions from your doctor about hydration, which may include:  Up to 3 hours before the procedure - you may continue to drink clear liquids, such as: ? Water. ? Clear fruit juice. ? Black coffee. ? Plain tea.  Eating and drinking Follow instructions from your doctor about eating and drinking, which may include:  8 hours before the procedure - stop eating heavy meals or foods such as meat, fried foods, or fatty foods.  6 hours before the procedure - stop eating light meals or foods, such as toast or cereal.  6 hours before the procedure - stop drinking milk or drinks that contain milk.  3 hours before the procedure - stop drinking clear liquids. General instructions  You will need to take out any dentures or retainers.  Plan to have someone take you home from the hospital or clinic.  If you will be going home right after the procedure, plan to have someone with you for 24 hours.  Ask your doctor about: ? Changing or stopping your normal medicines. This is important if you take diabetes medicines or blood thinners. ? Taking over-the-counter medicines, vitamins, herbs, and supplements. ? Taking medicines such as aspirin and ibuprofen. These medicines can thin your blood. Do not take these medicines unless your doctor tells you to take them. What happens during the procedure?  To lower your risk of infection, your doctors will wash or clean their hands.  An IV will be put into one of your veins.  You will be given a medicine to help you  relax (sedative).  A medicine may be sprayed or gargled. This numbs the back of your throat.  Your blood pressure, heart rate, and breathing will be watched.  You may be asked to lay on your left side.  A bite block will be placed in your mouth. This keeps you from biting the tube.  The tip of the TEE probe will be placed into the back of your mouth.  You will be asked to swallow.  Your doctor will take pictures of your heart.  The probe and bite block will be taken out. The procedure may vary among doctors and hospitals. What happens after the procedure?   Your blood pressure, heart rate, breathing rate, and blood oxygen level will be watched until the medicines you were given have worn off.  When you first wake up, your throat may feel sore and numb. This will get better over time. You will not be allowed to eat or drink until the numbness has gone away.  Do not drive for 24 hours if you were given a medicine to help you relax. Summary  TEE is a test that uses sound waves to take pictures of your heart.  You will be given a medicine to help you relax.  Do not drive for 24 hours if you were given a medicine to help you relax. This information is not intended to replace advice given to you by your health care provider. Make sure you discuss any questions you have with your health care provider. Document Revised:   02/01/2018 Document Reviewed: 08/16/2016 Elsevier Patient Education  [place] drive or operate machinery for the next 24hrs. Cannot make any important decision for the next 24 hrs due to recent anesthesia.

## 2019-09-23 NOTE — Transfer of Care (Signed)
Immediate Anesthesia Transfer of Care Note  Patient: Marissa Lewis  Procedure(s) Performed: TRANSESOPHAGEAL ECHOCARDIOGRAM (TEE) (N/A ) BUBBLE STUDY  Patient Location: Endoscopy Unit  Anesthesia Type:MAC  Level of Consciousness: drowsy  Airway & Oxygen Therapy: Patient Spontanous Breathing and Patient connected to nasal cannula oxygen  Post-op Assessment: Report given to RN, Post -op Vital signs reviewed and stable and Patient moving all extremities  Post vital signs: Reviewed and stable  Last Vitals:  Vitals Value Taken Time  BP 72/52 09/23/19 1238  Temp    Pulse 72 09/23/19 1240  Resp 16 09/23/19 1240  SpO2 95 % 09/23/19 1240  Vitals shown include unvalidated device data.  Last Pain:  Vitals:   09/23/19 1054  TempSrc: Oral  PainSc: 0-No pain         Complications: No apparent anesthesia complications. Giving IV fluid 100 mL bolus for BP

## 2019-09-23 NOTE — Op Note (Signed)
Transesophageal echocardiogram (TEE) : Preliminary report  Sedation: Per anesthesia records  TEE was performed without complications   LV: Normal EF. RV: Normal structure and function.  LA: Grossly normal.  Spontaneous echo contrast was present.  No thrombus present. Left atrial appendage: No spontaneous echo contrast was present.  No thrombus present. Inter atrial septum is intact without defect. RA: Grossly normal.   MV: Mild to moderate regurgitation late peaking, no stenosis, no vegetation noted.  TV: Mild Regurgitation,No stenosis, no vegetation noted. AV: No Regurgitation,No stenosis, no vegetation noted.   PV: Mild Regurgitation, No stenosis, no vegetation noted.   Thoracic and ascending aorta: No significant plaque.  Final report forthcoming.  Rex Kras, [place], [facility]  Pager: [phone] Office: [phone]

## 2019-09-23 NOTE — Progress Notes (Signed)
  Echocardiogram Echocardiogram Transesophageal has been performed.  Marissa Lewis 09/23/2019, 12:52 PM

## 2019-09-23 NOTE — Anesthesia Postprocedure Evaluation (Signed)
Anesthesia Post Note  Patient: ZELIE PILGRIM  Procedure(s) Performed: TRANSESOPHAGEAL ECHOCARDIOGRAM (TEE) (N/A ) BUBBLE STUDY     Patient location during evaluation: Endoscopy Anesthesia Type: General Level of consciousness: awake Pain management: pain level controlled Vital Signs Assessment: post-procedure vital signs reviewed and stable Respiratory status: spontaneous breathing Cardiovascular status: stable Postop Assessment: no apparent nausea or vomiting Anesthetic complications: no    Last Vitals:  Vitals:   09/23/19 1300 09/23/19 1305  BP: 105/75 120/90  Pulse: 64 63  Resp: (!) 21 12  Temp:    SpO2: 100% 100%    Last Pain:  Vitals:   09/23/19 1300  TempSrc:   PainSc: 0-No pain                 Marthe Dant

## 2019-09-23 NOTE — Interval H&P Note (Signed)
History and Physical Interval Note:  09/23/2019 11:08 AM  Marissa Lewis  has presented today for surgery, with the diagnosis of MITRAL REGURGITATION.  The various methods of treatment have been discussed with the patient and family. After consideration of risks, benefits and other options for treatment, the patient has consented to  Procedure(s): TRANSESOPHAGEAL ECHOCARDIOGRAM (TEE) (N/A) as a surgical intervention.  The patient's history has been reviewed, patient examined, no change in status, stable for surgery.  I have reviewed the patient's chart and labs.  Questions were answered to the patient's satisfaction.     Manaal Mandala [place], DO, [facility]

## 2019-09-29 ENCOUNTER — Encounter: Payer: Self-pay | Admitting: Cardiology

## 2019-09-29 ENCOUNTER — Ambulatory Visit: Payer: BC Managed Care – PPO | Admitting: Cardiology

## 2019-09-29 ENCOUNTER — Other Ambulatory Visit: Payer: Self-pay

## 2019-09-29 VITALS — BP 134/80 | HR 72 | Temp 97.7°F | Resp 17 | Ht 69.0 in | Wt 174.0 lb

## 2019-09-29 DIAGNOSIS — I340 Nonrheumatic mitral (valve) insufficiency: Secondary | ICD-10-CM | POA: Diagnosis not present

## 2019-09-29 DIAGNOSIS — Z712 Person consulting for explanation of examination or test findings: Secondary | ICD-10-CM

## 2019-09-29 DIAGNOSIS — I341 Nonrheumatic mitral (valve) prolapse: Secondary | ICD-10-CM

## 2019-09-29 NOTE — Progress Notes (Signed)
Marissa Lewis Date of Birth: 12-Nov-1970 MRN: [id number] Primary Care Provider:Baxley, Cresenciano Lick, MD Primary Cardiologist: Rex Kras, DO (established care 09/18/2019)  Date: 09/29/19 Last Office Visit: 09/11/2018  Chief Complaint  Patient presents with  . Mitral Regurgitation  . Follow-up    1 week  . Results    TEE    HPI  Marissa Lewis is a 49 y.o.  female who presents to the office with a chief complaint of "follow-up after transesophageal echocardiogram."   At the last office visit we reviewed her transthoracic echocardiogram report as well as the images and decided to proceed with transesophageal echocardiogram to evaluate the severity of mitral regurgitation and to evaluate the mitral apparatus.  Since last office visit patient has undergone a transesophageal echocardiogram and is here for follow-up.  Since last office visit patient denies any chest pain or shortness of breath at rest or with effort related activities.  She is accompanied by her husband Marissa Lewis at today's office visit as well.  I reviewed the transesophageal echocardiogram images with both the patient and her husband at today's visit.  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    Denies prior history of coronary artery disease, myocardial infarction, congestive heart failure, deep venous thrombosis, pulmonary embolism, stroke, transient ischemic attack.  FUNCTIONAL STATUS: Goes to the gym three times a week (mostly weights and cardio for 15 minutes).   ALLERGIES: Allergies  Allergen Reactions  . Penicillins Rash  . Sulfa Antibiotics Hives and Rash    MEDICATION LIST PRIOR TO VISIT: Current Outpatient Medications on File Prior to Visit  Medication Sig Dispense Refill  . Ascorbic Acid (VITAMIN C ER PO) Take 1 tablet by mouth daily.    . Biotin 1 MG CAPS Take by mouth.    . ergocalciferol (VITAMIN D2) 1.25  MG (50000 UT) capsule Take 1 capsule (50,000 Units total) by mouth once a week. 12 capsule 3  . ibuprofen (ADVIL) 200 MG tablet Take 400 mg by mouth every 6 (six) hours as needed for moderate pain.    . Multiple Vitamin (MULTIVITAMIN) capsule Take 1 capsule by mouth daily.    . Zinc 50 MG CAPS Take 1 capsule by mouth daily.    . clindamycin (CLEOCIN) 150 MG capsule Take 300 mg by mouth See admin instructions. For dental procedures    . fluconazole (DIFLUCAN) 150 MG tablet Take one dose now and repeat dose with SBE prophylaxis med next week. (Patient not taking: Reported on 09/29/2019) 2 tablet 1   No current facility-administered medications on file prior to visit.    PAST MEDICAL HISTORY: Past Medical History:  Diagnosis Date  . Depression   . Gilbert's syndrome   . Mitral regurgitation   . UTI (lower urinary tract infection)     PAST SURGICAL HISTORY: Past Surgical History:  Procedure Laterality Date  . APPENDECTOMY    . BUBBLE STUDY  09/23/2019   Procedure: BUBBLE STUDY;  Surgeon: Rex Kras, DO;  Location: [facility];  Service: Cardiovascular;;  . CESAREAN SECTION    . TEE WITHOUT CARDIOVERSION N/A 09/23/2019   Procedure: TRANSESOPHAGEAL ECHOCARDIOGRAM (TEE);  Surgeon: Rex Kras, DO;  Location: [facility];  Service: Cardiovascular;  Laterality: N/A;  . TONSILLECTOMY    . UTERINE FIBROID SURGERY      FAMILY HISTORY: The patient's family history includes Cancer in her maternal grandmother; Diabetes in her father, mother, and sister; Hyperlipidemia  in her brother and mother; Lung cancer in her father.   SOCIAL HISTORY:  The patient  reports that she has never smoked. She has never used smokeless tobacco. She reports current alcohol use. She reports that she does not use drugs.  Review of Systems  Constitution: Negative for chills and fever.  HENT: Negative for ear discharge, ear pain and nosebleeds.   Eyes: Negative for blurred vision and discharge.  Cardiovascular:  Negative for chest pain, claudication, dyspnea on exertion, leg swelling, near-syncope, orthopnea, palpitations, paroxysmal nocturnal dyspnea and syncope.  Respiratory: Negative for cough and shortness of breath.   Endocrine: Negative for polydipsia, polyphagia and polyuria.  Hematologic/Lymphatic: Negative for bleeding problem.  Skin: Negative for flushing and nail changes.  Musculoskeletal: Negative for muscle cramps, muscle weakness and myalgias.  Gastrointestinal: Negative for abdominal pain, dysphagia, hematemesis, hematochezia, melena, nausea and vomiting.  Neurological: Negative for dizziness, focal weakness and light-headedness.   PHYSICAL EXAM: Vitals with BMI 09/29/2019 09/23/2019 09/23/2019  Height '5\' 9"'$  - -  Weight 174 lbs - -  BMI 17.00 - -  Systolic 174 944 967  Diastolic 80 90 75  Pulse 72 63 64    CONSTITUTIONAL: Well-developed and well-nourished. No acute distress.  SKIN: Skin is warm and dry. No rash noted. No cyanosis. No pallor. No jaundice HEAD: Normocephalic and atraumatic.  EYES: No scleral icterus MOUTH/THROAT: Moist oral membranes.  NECK: No JVD present. No thyromegaly noted. No carotid bruits  LYMPHATIC: No visible cervical adenopathy.  CHEST Normal respiratory effort. No intercostal retractions  LUNGS: Clear to auscultation bilaterally. No stridor. No wheezes. No rales.  CARDIOVASCULAR: Regular rate and rhythm, positive S1-S2, no murmurs rubs or gallops appreciated. ABDOMINAL: No apparent ascites.  EXTREMITIES: No peripheral edema  HEMATOLOGIC: No significant bruising. NEUROLOGIC: Oriented to person, place, and time. Nonfocal. Normal muscle tone.  PSYCHIATRIC: Normal mood and affect. Normal behavior. Cooperative  CARDIAC DATABASE: EKG: 08/14/2017: Marked sinus bradycardia at 48 bpm, left atrial abnormality, normal axis, poor R-wave progression cannot exclude anterior infarct old.  Diffuse nonspecific T-wave abnormality.  09/18/2019: Sinus bradycardia, 50  bpm, poor R wave progression, nonspecific T wave abnormality, low voltage, cannot exclude anterior infarct old, no underlying injury pattern.  Echocardiogram: 09/05/2018: LVEF 60%. 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 Moderate eccentric posteriorly directed mitral regurgitation. Normal right atrial pressure.  09/11/2019: Normal LV systolic function with EF 60%. Normal diastolic filling pattern. Myxomatous degeneration with bileaflet prolapse. Moderate (Grade III) posteriorly directed mitral regurgitation. Mild tricuspid regurgitation.  No evidence of pulmonary hypertension.   Transesophageal echocardiogram 09/23/2019: LVEF 60-65%, mild LVH, normal diastolic parameters, myxomatous mitral valve with bileaflet prolapse.  Multiple jets noted with moderate late peaking regurgitation.  No mitral stenosis.  Agitated saline study negative for inter atrial shunting.  Stress Testing:  Treadmill exercise stress test 08/11/2016: The patient exercised according to Bruce Protocol, Total time recorded 6:23 min, achieving 101% of THR for age and 7.61 METS of work. Stress terminated due to fatigue and THR (>85% MPHR)/MPHR met. Normal BP response. There was no ST-T changes of ischemia with exercise stress test. There were no significant arrhythmias. Normal BP response. No e/o ischemia by GXT. Exercise tolerence is low normal for age .Continue Preventive therapy.  Heart Catheterization: None  LABORATORY DATA: CBC Latest Ref Rng & Units 03/21/2019 08/14/2017 04/13/2014  WBC 3.8 - 10.8 Thousand/uL 5.3 5.7 14.3(H)  Hemoglobin 11.7 - 15.5 g/dL 12.9 13.1 12.6  Hematocrit 35.0 - 45.0 % 38.1 38.7  41.1  Platelets 140 - 400 Thousand/uL 299 266 198    CMP Latest Ref Rng & Units 03/21/2019 08/14/2017 09/09/2012  Glucose 65 - 99 mg/dL 97 88 84  BUN 7 - 25 mg/dL '11 10 14  '$ Creatinine 0.50 - 1.10 mg/dL 0.99 0.87 0.83  Sodium 135 - 146 mmol/L 140 139 139  Potassium  3.5 - 5.3 mmol/L 4.0 3.9 3.8  Chloride 98 - 110 mmol/L 105 108 104  CO2 20 - 32 mmol/L '26 27 25  '$ Calcium 8.6 - 10.2 mg/dL 9.0 8.9 9.1  Total Protein 6.1 - 8.1 g/dL 7.2 6.7 6.4  Total Bilirubin 0.2 - 1.2 mg/dL 2.0(H) 2.1(H) 2.2(H)  Alkaline Phos 39 - 117 U/L - - 32(L)  AST 10 - 35 U/L '11 13 12  '$ ALT 6 - 29 U/L '9 11 8    '$ Lipid Panel     Component Value Date/Time   CHOL 201 (H) 03/21/2019 1000   TRIG 62 03/21/2019 1000   HDL 61 03/21/2019 1000   CHOLHDL 3.3 03/21/2019 1000   VLDL 9 09/09/2012 0936   LDLCALC 125 (H) 03/21/2019 1000    No results found for: HGBA1C No components found for: NTPROBNP Lab Results  Component Value Date   TSH 0.54 03/21/2019   TSH 0.55 08/14/2017   TSH 0.610 09/09/2012    Cardiac Panel (last 3 results) No results for input(s): CKTOTAL, CKMB, TROPONINIHS, RELINDX in the last 72 hours.  FINAL MEDICATION LIST END OF ENCOUNTER: No orders of the defined types were placed in this encounter.   There are no discontinued medications.   Current Outpatient Medications:  .  Ascorbic Acid (VITAMIN C ER PO), Take 1 tablet by mouth daily., Disp: , Rfl:  .  Biotin 1 MG CAPS, Take by mouth., Disp: , Rfl:  .  ergocalciferol (VITAMIN D2) 1.25 MG (50000 UT) capsule, Take 1 capsule (50,000 Units total) by mouth once a week., Disp: 12 capsule, Rfl: 3 .  ibuprofen (ADVIL) 200 MG tablet, Take 400 mg by mouth every 6 (six) hours as needed for moderate pain., Disp: , Rfl:  .  Multiple Vitamin (MULTIVITAMIN) capsule, Take 1 capsule by mouth daily., Disp: , Rfl:  .  Zinc 50 MG CAPS, Take 1 capsule by mouth daily., Disp: , Rfl:  .  clindamycin (CLEOCIN) 150 MG capsule, Take 300 mg by mouth See admin instructions. For dental procedures, Disp: , Rfl:  .  fluconazole (DIFLUCAN) 150 MG tablet, Take one dose now and repeat dose with SBE prophylaxis med next week. (Patient not taking: Reported on 09/29/2019), Disp: 2 tablet, Rfl: 1  IMPRESSION:    ICD-10-CM   1. Encounter to  discuss test results  Z71.2   2. Mitral valve prolapse  I34.1 PCV ECHOCARDIOGRAM COMPLETE  3. Moderate mitral regurgitation  I34.0 PCV ECHOCARDIOGRAM COMPLETE     RECOMMENDATIONS: Marissa Lewis is a 49 y.o. 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  Educated on the importance of better blood pressure management.  Will follow with primary team per patient.  Transesophageal echocardiogram results and images reviewed with the patient and her husband at today's visit.  1 year TTE follow up recommended.  Patient is asked to follow-up sooner if she has new onset of shortness of breath or new  symptoms.   Bilateral mitral valve prolapse: See above  Review of her labs from October 2020 illustrates elevated cholesterol levels. Patient states that she is working on lifestyle modifications and has any yearly physical coming up with her PCP who will be rechecking it. Will defer further management to PCP.  Orders Placed This Encounter  Procedures  . PCV ECHOCARDIOGRAM COMPLETE   --Continue cardiac medications as reconciled in final medication list. --Return in about 54 weeks (around 10/11/2020) for after Echo, MR, MVP. Or sooner if needed. --Continue follow-up with your primary care physician regarding the management of your other chronic comorbid conditions.  Patient's questions and concerns were addressed to her satisfaction. She voices understanding of the instructions provided during this encounter.   Total encounter time 45 minutes. Total Encounter Time as defined by the Centers for Medicare and Medicaid Services includes, in addition to the face-to-face time of a patient visit (documented in the note  above) non-face-to-face time: obtaining and reviewing outside history, ordering and reviewing medications, tests or procedures, care coordination (communications with other health care professionals or caregivers) and documentation in the medical record.  This note was created using a voice recognition software as a result there may be grammatical errors inadvertently enclosed that do not reflect the nature of this encounter. Every attempt is made to correct such errors.  Rex Kras, [place], [facility]  Pager: [phone] Office: [phone]

## 2020-03-22 ENCOUNTER — Other Ambulatory Visit: Payer: Self-pay

## 2020-03-22 ENCOUNTER — Other Ambulatory Visit: Payer: BC Managed Care – PPO | Admitting: Internal Medicine

## 2020-03-22 DIAGNOSIS — Z298 Encounter for other specified prophylactic measures: Secondary | ICD-10-CM

## 2020-03-22 DIAGNOSIS — E7800 Pure hypercholesterolemia, unspecified: Secondary | ICD-10-CM

## 2020-03-22 DIAGNOSIS — E559 Vitamin D deficiency, unspecified: Secondary | ICD-10-CM | POA: Diagnosis not present

## 2020-03-22 DIAGNOSIS — E785 Hyperlipidemia, unspecified: Secondary | ICD-10-CM

## 2020-03-22 DIAGNOSIS — I340 Nonrheumatic mitral (valve) insufficiency: Secondary | ICD-10-CM

## 2020-03-22 DIAGNOSIS — Z0000 Encounter for general adult medical examination without abnormal findings: Secondary | ICD-10-CM

## 2020-03-22 DIAGNOSIS — Z2989 Encounter for other specified prophylactic measures: Secondary | ICD-10-CM

## 2020-03-22 DIAGNOSIS — I341 Nonrheumatic mitral (valve) prolapse: Secondary | ICD-10-CM

## 2020-03-23 LAB — CBC WITH DIFFERENTIAL/PLATELET
Absolute Monocytes: 373 cells/uL (ref 200–950)
Basophils Absolute: 41 cells/uL (ref 0–200)
Basophils Relative: 0.6 %
Eosinophils Absolute: 131 cells/uL (ref 15–500)
Eosinophils Relative: 1.9 %
HCT: 39.1 % (ref 35.0–45.0)
Hemoglobin: 13.4 g/dL (ref 11.7–15.5)
Lymphs Abs: 3126 cells/uL (ref 850–3900)
MCH: 31.4 pg (ref 27.0–33.0)
MCHC: 34.3 g/dL (ref 32.0–36.0)
MCV: 91.6 fL (ref 80.0–100.0)
MPV: 10.4 fL (ref 7.5–12.5)
Monocytes Relative: 5.4 %
Neutro Abs: 3229 cells/uL (ref 1500–7800)
Neutrophils Relative %: 46.8 %
Platelets: 264 10*3/uL (ref 140–400)
RBC: 4.27 10*6/uL (ref 3.80–5.10)
RDW: 12.4 % (ref 11.0–15.0)
Total Lymphocyte: 45.3 %
WBC: 6.9 10*3/uL (ref 3.8–10.8)

## 2020-03-23 LAB — TSH: TSH: 1.44 mIU/L

## 2020-03-23 LAB — COMPLETE METABOLIC PANEL WITH GFR
AG Ratio: 1.9 (calc) (ref 1.0–2.5)
ALT: 12 U/L (ref 6–29)
AST: 15 U/L (ref 10–35)
Albumin: 4.5 g/dL (ref 3.6–5.1)
Alkaline phosphatase (APISO): 47 U/L (ref 31–125)
BUN/Creatinine Ratio: 10 (calc) (ref 6–22)
BUN: 11 mg/dL (ref 7–25)
CO2: 25 mmol/L (ref 20–32)
Calcium: 9.5 mg/dL (ref 8.6–10.2)
Chloride: 105 mmol/L (ref 98–110)
Creat: 1.11 mg/dL — ABNORMAL HIGH (ref 0.50–1.10)
GFR, Est African American: 68 mL/min/{1.73_m2} (ref >=60)
GFR, Est Non African American: 59 mL/min/{1.73_m2} — ABNORMAL LOW (ref >=60)
Globulin: 2.4 g/dL (calc) (ref 1.9–3.7)
Glucose, Bld: 123 mg/dL — ABNORMAL HIGH (ref 65–99)
Potassium: 3.7 mmol/L (ref 3.5–5.3)
Sodium: 139 mmol/L (ref 135–146)
Total Bilirubin: 1.5 mg/dL — ABNORMAL HIGH (ref 0.2–1.2)
Total Protein: 6.9 g/dL (ref 6.1–8.1)

## 2020-03-23 LAB — LIPID PANEL
Cholesterol: 199 mg/dL (ref <200)
HDL: 68 mg/dL (ref >=50)
LDL Cholesterol (Calc): 114 mg/dL (calc) — ABNORMAL HIGH
Non-HDL Cholesterol (Calc): 131 mg/dL (calc) — ABNORMAL HIGH (ref <130)
Total CHOL/HDL Ratio: 2.9 (calc) (ref <5.0)
Triglycerides: 76 mg/dL (ref <150)

## 2020-03-23 LAB — VITAMIN D 25 HYDROXY (VIT D DEFICIENCY, FRACTURES): Vit D, 25-Hydroxy: 18 ng/mL — ABNORMAL LOW (ref 30–100)

## 2020-03-25 ENCOUNTER — Other Ambulatory Visit: Payer: Self-pay

## 2020-03-25 ENCOUNTER — Ambulatory Visit (INDEPENDENT_AMBULATORY_CARE_PROVIDER_SITE_OTHER): Payer: BC Managed Care – PPO | Admitting: Internal Medicine

## 2020-03-25 ENCOUNTER — Encounter: Payer: Self-pay | Admitting: Internal Medicine

## 2020-03-25 VITALS — BP 110/62 | HR 70 | Ht 69.0 in | Wt 170.0 lb

## 2020-03-25 DIAGNOSIS — E7800 Pure hypercholesterolemia, unspecified: Secondary | ICD-10-CM

## 2020-03-25 DIAGNOSIS — Z0000 Encounter for general adult medical examination without abnormal findings: Secondary | ICD-10-CM

## 2020-03-25 DIAGNOSIS — I340 Nonrheumatic mitral (valve) insufficiency: Secondary | ICD-10-CM | POA: Diagnosis not present

## 2020-03-25 DIAGNOSIS — R8290 Unspecified abnormal findings in urine: Secondary | ICD-10-CM

## 2020-03-25 DIAGNOSIS — E559 Vitamin D deficiency, unspecified: Secondary | ICD-10-CM

## 2020-03-25 LAB — POCT URINALYSIS DIPSTICK
Bilirubin, UA: NEGATIVE
Glucose, UA: NEGATIVE
Ketones, UA: NEGATIVE
Nitrite, UA: NEGATIVE
Protein, UA: NEGATIVE
Spec Grav, UA: 1.025 (ref 1.010–1.025)
Urobilinogen, UA: 0.2 E.U./dL
pH, UA: 6.5 (ref 5.0–8.0)

## 2020-03-25 MED ORDER — ERGOCALCIFEROL 1.25 MG (50000 UT) PO CAPS: 50000.0000 [IU] | ORAL_CAPSULE | ORAL | 3 refills | Status: DC

## 2020-03-25 NOTE — Progress Notes (Signed)
   Subjective:    Patient ID: Marissa Lewis, female    DOB: 01-31-1971, 49 y.o.   MRN: [id number]  HPI  49 year old Female for health maintenance exam and evaluation of medical issues.  Patient tries to exercise regularly at gym. She has a history of mitral regurgitation and mitral valve prolapse. Followed by Cardiologist, Dr.Tolia. Has no complaint of chest pain. Had transesophageal echocardiogram in April. 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 EKG in April showed sinus bradycardia, poor R wave progression, nonspecific T wave abnormality. Echocardiogram in 2020 showed left ventricular ejection fraction of 60% with severe myxomatous degeneration anterior more than posterior mitral leaflet prolapse. Moderate mitral regurgitation. Normal right atrial pressure. Repeat study in April 2021 had similar findings.  Past medical history includes: Appendectomy with lysis of adhesions, cesarean section x1 natural childbirth x1, tonsillectomy and adenoidectomy in 1980, and uterine fibroid surgery. 10-year history of bilateral breast implants by Dr. Stephanie Coup. Removal of right breast adenoma in [facility] April 1989. Urticaria August 2011.  History of Bacterial vaginosis.  Non-smoker. Social alcohol consumption.  Social history: She has two children. She works for the [employer].   Family history: Cancer in maternal grandmother. Diabetes in father, mother and sister. Hyperlipidemia in her brother and mother. Lung cancer in her father.  History of Gilbert's syndrome diagnosed by Dr. Carlean Purl in 2008. In 2005 she had cholestatic jaundice thought to be due to oral contraceptives.  She is allergic to Penicillin-causes rash and hives. SBE prophylaxis has been recommended by cardiologist     Review of Systems history of constipation. Called February 2021 complaining of constipation and rectal bleeding. Recommended Miralax daily.     Objective:    Physical Exam Blood pressure is 110/62, pulse 70 regular pulse oximetry 98% weight 170 pounds. BMI 25.10.  Skin warm and dry. No cervical adenopathy. No thyromegaly. TMs are clear. Chest is clear to auscultation without rales or wheezing. Cardiac exam regular rate and rhythm without ectopy.II/VI SEM Abdomen soft nondistended without hepatosplenomegaly masses or tenderness. Bilateral breast implants. She has vaginal discharge. Urine dipstick is abnormal and culture was sent. No lower extremity pitting edema. Neuro intact without focal deficits.       Assessment & Plan:  Vitamin D level is low at 18. Placed on vitamin D 50,000 units weekly for 1 year  Urine culture is pending but she may need to be treated for bacterial vaginosis pending culture results  History of bilateral breast implants  History of constipation-recommend MiraLAX  History of Gilbert'ssyndrome diagnosed by Dr. Carlean Purl  History of mitral regurgitation and mitral valve prolapse followed by Cardiology. SBE prophylaxis recommended. She is allergic to Penicillin.  Plan: Return in 1 year or as needed.

## 2020-03-25 NOTE — Patient Instructions (Addendum)
See GYN physician to follow up on Endometriosis annually. Call us if rectal bleeding recurs. Take Drisdol 50,000 units weekly for Vitamin D deficiency. Have mammogram in the near future. No UTI. May need to be treated for bacterial vaginosis.

## 2020-03-26 LAB — URINE CULTURE
MICRO NUMBER:: 11131446
SPECIMEN QUALITY:: ADEQUATE

## 2020-03-26 LAB — URINALYSIS, MICROSCOPIC ONLY: Hyaline Cast: NONE SEEN /LPF

## 2020-05-24 ENCOUNTER — Ambulatory Visit (INDEPENDENT_AMBULATORY_CARE_PROVIDER_SITE_OTHER): Payer: BC Managed Care – PPO | Admitting: Internal Medicine

## 2020-05-24 ENCOUNTER — Other Ambulatory Visit: Payer: Self-pay

## 2020-05-24 ENCOUNTER — Encounter: Payer: Self-pay | Admitting: Internal Medicine

## 2020-05-24 ENCOUNTER — Telehealth: Payer: Self-pay | Admitting: Internal Medicine

## 2020-05-24 VITALS — HR 108 | Temp 98.4°F

## 2020-05-24 DIAGNOSIS — R0981 Nasal congestion: Secondary | ICD-10-CM | POA: Diagnosis not present

## 2020-05-24 DIAGNOSIS — I340 Nonrheumatic mitral (valve) insufficiency: Secondary | ICD-10-CM

## 2020-05-24 DIAGNOSIS — R059 Cough, unspecified: Secondary | ICD-10-CM | POA: Diagnosis not present

## 2020-05-24 DIAGNOSIS — R432 Parageusia: Secondary | ICD-10-CM

## 2020-05-24 DIAGNOSIS — Z20822 Contact with and (suspected) exposure to covid-19: Secondary | ICD-10-CM | POA: Diagnosis not present

## 2020-05-24 MED ORDER — BENZONATATE 100 MG PO CAPS
100.0000 mg | ORAL_CAPSULE | Freq: Three times a day (TID) | ORAL | 0 refills | Status: DC | PRN
Start: 2020-05-24 — End: 2020-11-16

## 2020-05-24 MED ORDER — FLUCONAZOLE 150 MG PO TABS
150.0000 mg | ORAL_TABLET | Freq: Once | ORAL | 0 refills | Status: AC
Start: 2020-05-24 — End: 2020-05-24

## 2020-05-24 MED ORDER — AZITHROMYCIN 250 MG PO TABS
ORAL_TABLET | ORAL | 0 refills | Status: DC
Start: 2020-05-24 — End: 2020-11-16

## 2020-05-24 NOTE — Telephone Encounter (Signed)
scheduled

## 2020-05-24 NOTE — Telephone Encounter (Signed)
Marissa Lewis [phone]  Haydan called to say she lost her taste and smell on Friday 05/21/2020, she had fever then that is gone now, she has head congestion, cough, runny nose. Has had COVID vaccines

## 2020-05-24 NOTE — Patient Instructions (Addendum)
Quarantine at home.  COVID-19 test is pending.  Take Zithromax Z-PAK as directed.  Call if become hypoxic or symptoms worsen.  Monitor pulse oximetry at home.  Stay well-hydrated.  May take Diflucan if develop Candida vaginitis while on antibiotics.

## 2020-05-24 NOTE — Progress Notes (Signed)
   Subjective:    Patient ID: Marissa Lewis, female    DOB: August 17, 1970, 49 y.o.   MRN: [id number]  HPI 49 year old Female, longstanding patient in this practice with history of mitral regurgitation and mitral valve prolapse.  Followed by cardiologist, Dr. Odis Hollingshead.  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  History of left ventricular ejection fraction on echocardiogram in 2020 of 60% with severe myxomatous degeneration anterior more than posterior mitral leaflet prolapse.  Normal right atrial pressure.  Repeat study in 2021 had similar findings.  Had TEE in April 2021 with bubble study.  Social history: She has 2 children.  She works for the [employer] and is married.  Husband has developed symptoms of COVID-19.  She says his oxygen level is low and that he has gone to the hospital for evaluation.  She lost taste and smell on Friday, December 24.  Had fever which resolved.  Has had head congestion cough and runny nose.  She has had Covid vaccines.  Husband has not been vaccinated.  She says she has had 3 vaccines but only 2 are in her chart thus far with dates of  March and April of this year.  Due to the COVID-19 pandemic, she is seen outside of her car just outside of my office.  She is agreeable to visit in this format today.  She is being seen outside due to the coronavirus pandemic.  Has had close exposure to her husband who very well may have COVID-19 and is gone to the hospital.  Review of Systems no nausea or vomiting.  Complains of cough, dysgeusia and nasal congestion.  No tachypnea.  Slight scratchy throat.     Objective:   Physical Exam Pulse is 108 and regular.  Temperature 98.4 degrees pulse oximetry 97%. Pharynx very slightly injected.  TMs are clear.  Neck is supple.  Chest is clear to auscultation. COVID-19 test obtained.  Results are pending.     Assessment & Plan:  Probable COVID-19-test is pending.   Currently not hypoxic and is stable.  History of mitral regurgitation followed by cardiologist  Plan: Must quarantine at home until results are back.  Treated today with Zithromax Z-PAK 2 p.o. day 1 followed by 1 p.o. days 2 through 5.  May take Diflucan 1 dose if develop Candida vaginitis while on antibiotics.  Tessalon Perles 100 mg by mouth 3 times daily as needed for cough.  Rest and drink plenty of fluids.  May take Tylenol for fever.

## 2020-05-24 NOTE — Telephone Encounter (Signed)
She can have car visit

## 2020-05-26 LAB — SARS-COV-2 RNA,(COVID-19) QUALITATIVE NAAT: SARS CoV2 RNA: DETECTED — AB

## 2020-05-27 ENCOUNTER — Telehealth: Payer: Self-pay | Admitting: Internal Medicine

## 2020-05-27 NOTE — Telephone Encounter (Signed)
Faxed Positive COVID-19 results to [facility] [phone] along with demographics.

## 2020-06-30 ENCOUNTER — Telehealth: Payer: Self-pay | Admitting: Internal Medicine

## 2020-06-30 ENCOUNTER — Ambulatory Visit: Payer: BC Managed Care – PPO | Admitting: Internal Medicine

## 2020-06-30 ENCOUNTER — Encounter: Payer: Self-pay | Admitting: Internal Medicine

## 2020-06-30 NOTE — Telephone Encounter (Signed)
Called patient regarding appt for form completion. Got voice mail and left message.. Patient has form for adult day care position and I requested OV before signing.

## 2020-09-28 ENCOUNTER — Other Ambulatory Visit: Payer: BC Managed Care – PPO

## 2020-10-04 ENCOUNTER — Ambulatory Visit: Payer: BC Managed Care – PPO | Admitting: Cardiology

## 2020-11-16 ENCOUNTER — Encounter: Payer: Self-pay | Admitting: Internal Medicine

## 2020-11-16 ENCOUNTER — Ambulatory Visit: Payer: BC Managed Care – PPO | Admitting: Internal Medicine

## 2020-11-16 ENCOUNTER — Other Ambulatory Visit: Payer: Self-pay

## 2020-11-16 ENCOUNTER — Telehealth: Payer: Self-pay | Admitting: Internal Medicine

## 2020-11-16 VITALS — BP 100/70 | HR 84 | Ht 69.0 in | Wt 166.0 lb

## 2020-11-16 DIAGNOSIS — R300 Dysuria: Secondary | ICD-10-CM

## 2020-11-16 DIAGNOSIS — R8290 Unspecified abnormal findings in urine: Secondary | ICD-10-CM

## 2020-11-16 DIAGNOSIS — N3000 Acute cystitis without hematuria: Secondary | ICD-10-CM | POA: Diagnosis not present

## 2020-11-16 DIAGNOSIS — R350 Frequency of micturition: Secondary | ICD-10-CM | POA: Diagnosis not present

## 2020-11-16 DIAGNOSIS — Z131 Encounter for screening for diabetes mellitus: Secondary | ICD-10-CM

## 2020-11-16 LAB — POCT GLUCOSE (DEVICE FOR HOME USE): POC Glucose: 105 mg/dl — AB (ref 70–99)

## 2020-11-16 LAB — POCT URINALYSIS DIPSTICK
Bilirubin, UA: NEGATIVE
Glucose, UA: NEGATIVE
Ketones, UA: NEGATIVE
Nitrite, UA: NEGATIVE
Protein, UA: NEGATIVE
Spec Grav, UA: 1.015 (ref 1.010–1.025)
Urobilinogen, UA: 0.2 E.U./dL
pH, UA: 6 (ref 5.0–8.0)

## 2020-11-16 MED ORDER — FLUCONAZOLE 150 MG PO TABS: 150.0000 mg | ORAL_TABLET | Freq: Once | ORAL | 0 refills | Status: AC

## 2020-11-16 MED ORDER — CIPROFLOXACIN HCL 250 MG PO TABS: 250.0000 mg | ORAL_TABLET | Freq: Two times a day (BID) | ORAL | 0 refills | Status: DC

## 2020-11-16 NOTE — Progress Notes (Signed)
   Subjective:    Patient ID: Marissa Lewis, female    DOB: [birth year], 50 y.o.   MRN: [id number]  HPI 86 tear old Female presents with urinary frequency.  This caused her to be concerned about possibly having diabetes mellitus.  Says she has been drinking more liquids than usual.  Has noticed some numbness in left index finger for few days.  No injury to her finger that she is aware of.  Also complains of seeing scotomata today.    Review of Systems no fever, chills, nausea, vomiting or back pain     Objective:   Physical Exam Blood pressure excellent at 100/70 pulse 84 regular pulse oximetry 98% weight 166 pounds height 5 feet 9 inches BMI 24.51. No CVA tenderness.  Dipstick UA shows moderate occult blood.  Urine is cloudy.  Specific gravity 1.015.  2+ LE noted on urine dipstick.  No abnormality noted on left index finger.  No paronychia.  Full range of motion in the left index finger.  Accu-Chek nonfasting is 105.      Assessment & Plan:   I do not think patient currently has diabetes mellitus.  I do think she has a urinary tract infection that would explain her symptoms.  I am not sure what is causing dysesthesia in her index finger.  Plan: She will be treated with Cipro 250 mg twice daily for 7 days.  Urine culture obtained.  Call if symptoms not improving.

## 2020-11-16 NOTE — Telephone Encounter (Signed)
scheduled

## 2020-11-16 NOTE — Telephone Encounter (Signed)
Marissa Lewis [phone]  [name] called to say she is wandering if she is showing signs of diabetes, she is having frequent urination for last few days, drinking a lot more than usual, left index finger is numb on corner and yesterday and today she is seeing spots. No burning or pain.

## 2020-11-16 NOTE — Patient Instructions (Signed)
Accu-Chek is within normal limits for nonfasting individual at 105.  Urine dipstick is abnormal and a culture was sent.  Take Cipro 250 mg twice daily for 7 days.  Call if symptoms not improving.

## 2020-11-17 LAB — URINALYSIS, MICROSCOPIC ONLY: Hyaline Cast: NONE SEEN /LPF

## 2020-11-17 LAB — URINE CULTURE
MICRO NUMBER:: 12031944
SPECIMEN QUALITY:: ADEQUATE

## 2021-03-25 ENCOUNTER — Other Ambulatory Visit: Payer: Self-pay

## 2021-03-25 ENCOUNTER — Other Ambulatory Visit: Payer: BC Managed Care – PPO

## 2021-03-25 DIAGNOSIS — E559 Vitamin D deficiency, unspecified: Secondary | ICD-10-CM | POA: Diagnosis not present

## 2021-03-25 DIAGNOSIS — Z0000 Encounter for general adult medical examination without abnormal findings: Secondary | ICD-10-CM | POA: Diagnosis not present

## 2021-03-25 DIAGNOSIS — I340 Nonrheumatic mitral (valve) insufficiency: Secondary | ICD-10-CM | POA: Diagnosis not present

## 2021-03-25 DIAGNOSIS — Z1329 Encounter for screening for other suspected endocrine disorder: Secondary | ICD-10-CM

## 2021-03-25 DIAGNOSIS — E7800 Pure hypercholesterolemia, unspecified: Secondary | ICD-10-CM

## 2021-03-25 LAB — CBC WITH DIFFERENTIAL/PLATELET
Absolute Monocytes: 277 cells/uL (ref 200–950)
Basophils Absolute: 41 cells/uL (ref 0–200)
Basophils Relative: 0.7 %
Eosinophils Absolute: 100 cells/uL (ref 15–500)
Eosinophils Relative: 1.7 %
HCT: 38.5 % (ref 35.0–45.0)
Hemoglobin: 13 g/dL (ref 11.7–15.5)
Lymphs Abs: 2236 cells/uL (ref 850–3900)
MCH: 30.4 pg (ref 27.0–33.0)
MCHC: 33.8 g/dL (ref 32.0–36.0)
MCV: 90.2 fL (ref 80.0–100.0)
MPV: 10.4 fL (ref 7.5–12.5)
Monocytes Relative: 4.7 %
Neutro Abs: 3245 cells/uL (ref 1500–7800)
Neutrophils Relative %: 55 %
Platelets: 306 10*3/uL (ref 140–400)
RBC: 4.27 10*6/uL (ref 3.80–5.10)
RDW: 12.6 % (ref 11.0–15.0)
Total Lymphocyte: 37.9 %
WBC: 5.9 10*3/uL (ref 3.8–10.8)

## 2021-03-25 LAB — COMPLETE METABOLIC PANEL WITH GFR
AG Ratio: 1.9 (calc) (ref 1.0–2.5)
ALT: 9 U/L (ref 6–29)
AST: 12 U/L (ref 10–35)
Albumin: 4.3 g/dL (ref 3.6–5.1)
Alkaline phosphatase (APISO): 40 U/L (ref 31–125)
BUN: 12 mg/dL (ref 7–25)
CO2: 24 mmol/L (ref 20–32)
Calcium: 9.1 mg/dL (ref 8.6–10.2)
Chloride: 108 mmol/L (ref 98–110)
Creat: 0.91 mg/dL (ref 0.50–0.99)
Globulin: 2.3 g/dL (calc) (ref 1.9–3.7)
Glucose, Bld: 94 mg/dL (ref 65–99)
Potassium: 4.3 mmol/L (ref 3.5–5.3)
Sodium: 139 mmol/L (ref 135–146)
Total Bilirubin: 1.8 mg/dL — ABNORMAL HIGH (ref 0.2–1.2)
Total Protein: 6.6 g/dL (ref 6.1–8.1)
eGFR: 78 mL/min/{1.73_m2} (ref >=60)

## 2021-03-25 LAB — LIPID PANEL
Cholesterol: 193 mg/dL (ref <200)
HDL: 70 mg/dL (ref >=50)
LDL Cholesterol (Calc): 108 mg/dL (calc) — ABNORMAL HIGH
Non-HDL Cholesterol (Calc): 123 mg/dL (calc) (ref <130)
Total CHOL/HDL Ratio: 2.8 (calc) (ref <5.0)
Triglycerides: 61 mg/dL (ref <150)

## 2021-03-25 LAB — VITAMIN D 25 HYDROXY (VIT D DEFICIENCY, FRACTURES): Vit D, 25-Hydroxy: 26 ng/mL — ABNORMAL LOW (ref 30–100)

## 2021-03-25 LAB — TSH: TSH: 0.67 mIU/L

## 2021-03-28 ENCOUNTER — Ambulatory Visit (INDEPENDENT_AMBULATORY_CARE_PROVIDER_SITE_OTHER): Payer: BC Managed Care – PPO | Admitting: Internal Medicine

## 2021-03-28 ENCOUNTER — Encounter: Payer: Self-pay | Admitting: Internal Medicine

## 2021-03-28 ENCOUNTER — Other Ambulatory Visit: Payer: Self-pay

## 2021-03-28 VITALS — BP 114/68 | HR 61 | Temp 99.0°F | Ht 69.0 in | Wt 170.0 lb

## 2021-03-28 DIAGNOSIS — I340 Nonrheumatic mitral (valve) insufficiency: Secondary | ICD-10-CM

## 2021-03-28 DIAGNOSIS — E559 Vitamin D deficiency, unspecified: Secondary | ICD-10-CM | POA: Diagnosis not present

## 2021-03-28 DIAGNOSIS — Z9882 Breast implant status: Secondary | ICD-10-CM

## 2021-03-28 DIAGNOSIS — I341 Nonrheumatic mitral (valve) prolapse: Secondary | ICD-10-CM | POA: Diagnosis not present

## 2021-03-28 DIAGNOSIS — Z0000 Encounter for general adult medical examination without abnormal findings: Secondary | ICD-10-CM | POA: Diagnosis not present

## 2021-03-28 LAB — POCT URINALYSIS DIP (CLINITEK)
Bilirubin, UA: NEGATIVE
Glucose, UA: NEGATIVE mg/dL
Ketones, POC UA: NEGATIVE mg/dL
Nitrite, UA: NEGATIVE
POC PROTEIN,UA: NEGATIVE
Spec Grav, UA: 1.015 (ref 1.010–1.025)
Urobilinogen, UA: 0.2 E.U./dL
pH, UA: 6.5 (ref 5.0–8.0)

## 2021-03-28 MED ORDER — ERGOCALCIFEROL 1.25 MG (50000 UT) PO CAPS: 50000.0000 [IU] | ORAL_CAPSULE | ORAL | 3 refills | Status: DC

## 2021-03-28 NOTE — Progress Notes (Signed)
   Subjective:    Patient ID: Marissa Lewis, female    DOB: 1970-08-30, 50 y.o.   MRN: [id number]  HPI 50 year old  Female seen for health maintenance exam.  Her general health is excellent.  She tries to get regular exercise.  She is followed by cardiologist Dr. Terri Skains.  History of mitral regurgitation.  She had transesophageal echocardiogram in 2021.  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    Past medical history: Cesarean section, uterine fibroid surgery, appendectomy, tonsillectomy and adenoidectomy in 1980.  Bilateral breast implants by Dr. Stephanie Coup about 10 years ago.  Right breast adenoma removed at [facility] in April 1999.  Urticaria August 2011.  Non-smoker.  Social alcohol consumption.  History of bacterial vaginosis.  Social history: She has 2 children and works for the [employer].  Married.  Never smoked.  She had COVID-19 in December 2021 but recovered  History of Gilbert's syndrome diagnosed by Dr. Carlean Purl in 2008.  In 2005 she had cholestatic jaundice thought to be due to oral contraceptives.  She is allergic to Penicillin-it causes rash and hives.  SBE prophylaxis has been recommended by cardiologist.  Family history colon cancer maternal grandmother.  Diabetes in father, mother and sister.  Hyperlipidemia in brother and mother.  Lung cancer in father.    Review of Systems     Objective:   Physical Exam  Wt 170 pounds, BP 114/68, pulse 61 regular.  Skin: Warm and dry.  No cervical adenopathy.  No thyromegaly.  No carotid bruits.  Chest is clear.  Cardiac exam: Regular rate and rhythm.  Breast: Bilateral implants abdomen: Soft nondistended without hepatosplenomegaly masses or tenderness.  Pelvic exam deferred to GYN physician.  No lower extremity pitting edema.  Neuro is intact without focal deficits. Regular rate and rhythm without murmur.      Assessment & Plan:  History of vitamin D  deficiency.  High-dose vitamin D was refilled for a year.  Level is low at 26 but improved from 18 a year ago.  History of bilateral breast implants  History of Gilbert's syndrome which is benign  Mitral regurgitation and mitral valve prolapse followed by cardiology.  She is allergic to penicillin.  SBE prophylaxis recommended by Dr. Roda Shutters  History of COVID-19.  Recommend COVID booster.  Suggest annual flu vaccine.  Tetanus immunization is up-to-date.  Plan: Return in 1 year or as needed.  Plan: Return in 1 year or as needed.  Take high-dose vitamin D weekly

## 2021-03-30 ENCOUNTER — Other Ambulatory Visit: Payer: Self-pay

## 2021-03-30 DIAGNOSIS — Z1231 Encounter for screening mammogram for malignant neoplasm of breast: Secondary | ICD-10-CM

## 2021-04-04 ENCOUNTER — Other Ambulatory Visit: Payer: Self-pay

## 2021-04-04 ENCOUNTER — Emergency Department (HOSPITAL_COMMUNITY)
Admission: EM | Admit: 2021-04-04 | Discharge: 2021-04-04 | Disposition: A | Discharge status: Home / Self Care | Payer: BC Managed Care – PPO | Attending: Emergency Medicine | Admitting: Emergency Medicine

## 2021-04-04 ENCOUNTER — Encounter (HOSPITAL_COMMUNITY): Payer: Self-pay

## 2021-04-04 ENCOUNTER — Emergency Department (HOSPITAL_COMMUNITY): Payer: BC Managed Care – PPO

## 2021-04-04 ENCOUNTER — Ambulatory Visit
Admission: EM | Admit: 2021-04-04 | Discharge: 2021-04-04 | Disposition: A | Discharge status: Home / Self Care | Payer: BC Managed Care – PPO

## 2021-04-04 DIAGNOSIS — R0602 Shortness of breath: Secondary | ICD-10-CM | POA: Insufficient documentation

## 2021-04-04 DIAGNOSIS — Z5321 Procedure and treatment not carried out due to patient leaving prior to being seen by health care provider: Secondary | ICD-10-CM | POA: Diagnosis not present

## 2021-04-04 DIAGNOSIS — F419 Anxiety disorder, unspecified: Secondary | ICD-10-CM | POA: Diagnosis not present

## 2021-04-04 LAB — CBC WITH DIFFERENTIAL/PLATELET
Abs Immature Granulocytes: 0.01 10*3/uL (ref 0.00–0.07)
Basophils Absolute: 0 10*3/uL (ref 0.0–0.1)
Basophils Relative: 1 %
Eosinophils Absolute: 0.1 10*3/uL (ref 0.0–0.5)
Eosinophils Relative: 1 %
HCT: 36.9 % (ref 36.0–46.0)
Hemoglobin: 12.6 g/dL (ref 12.0–15.0)
Immature Granulocytes: 0 %
Lymphocytes Relative: 37 %
Lymphs Abs: 2.1 10*3/uL (ref 0.7–4.0)
MCH: 30.4 pg (ref 26.0–34.0)
MCHC: 34.1 g/dL (ref 30.0–36.0)
MCV: 88.9 fL (ref 80.0–100.0)
Monocytes Absolute: 0.3 10*3/uL (ref 0.1–1.0)
Monocytes Relative: 6 %
Neutro Abs: 3.1 10*3/uL (ref 1.7–7.7)
Neutrophils Relative %: 55 %
Platelets: 270 10*3/uL (ref 150–400)
RBC: 4.15 MIL/uL (ref 3.87–5.11)
RDW: 12.5 % (ref 11.5–15.5)
WBC: 5.6 10*3/uL (ref 4.0–10.5)
nRBC: 0 % (ref 0.0–0.2)

## 2021-04-04 LAB — COMPREHENSIVE METABOLIC PANEL
ALT: 16 U/L (ref 0–44)
AST: 18 U/L (ref 15–41)
Albumin: 3.9 g/dL (ref 3.5–5.0)
Alkaline Phosphatase: 45 U/L (ref 38–126)
Anion gap: 6 (ref 5–15)
BUN: 9 mg/dL (ref 6–20)
CO2: 24 mmol/L (ref 22–32)
Calcium: 9 mg/dL (ref 8.9–10.3)
Chloride: 107 mmol/L (ref 98–111)
Creatinine, Ser: 0.91 mg/dL (ref 0.44–1.00)
GFR, Estimated: >60 mL/min (ref >60)
Glucose, Bld: 103 mg/dL — ABNORMAL HIGH (ref 70–99)
Potassium: 3.8 mmol/L (ref 3.5–5.1)
Sodium: 137 mmol/L (ref 135–145)
Total Bilirubin: 1.6 mg/dL — ABNORMAL HIGH (ref 0.3–1.2)
Total Protein: 6.6 g/dL (ref 6.5–8.1)

## 2021-04-04 LAB — I-STAT BETA HCG BLOOD, ED (MC, WL, AP ONLY): I-stat hCG, quantitative: <5 m[IU]/mL (ref <5)

## 2021-04-04 LAB — TROPONIN I (HIGH SENSITIVITY): Troponin I (High Sensitivity): 3 ng/L (ref <18)

## 2021-04-04 NOTE — ED Triage Notes (Signed)
Patient reports that she developed feeling of shortness of breath and feeling like she cant get a good breath after she arrived at work today. Denis any resp illness, no pain. Does reports some anxiety but reports nothing sparked it today

## 2021-04-04 NOTE — ED Notes (Signed)
Pt not answering for vitals.  

## 2021-04-04 NOTE — ED Provider Notes (Signed)
Emergency Medicine Provider Triage Evaluation Note  Marissa Lewis , a 50 y.o. female  was evaluated in triage.  Pt complains of shortness of breath.  History of mitral valve prolapse, she has had some shortness of breath previously related to anxiety but she is usually been able to calm herself down and this resolves but this morning she has been more persistently short of breath and having some associated chest tightness and heaviness.  Pain is not worse with deep breath.  No history of blood clot.  Patient does report that she is feeling anxious and is a bit tearful on evaluation  Review of Systems  Positive: Shortness of breath, chest tightness Negative: Fever, cough, abdominal pain  Physical Exam  BP 123/84 (BP Location: Left Arm)   Pulse (!) 57   Temp 98.3 F (36.8 C) (Oral)   Resp 18   SpO2 100%  Gen:   Awake, no distress   Resp:  Normal effort  MSK:   Moves extremities without difficulty  Other:    Medical Decision Making  Medically screening exam initiated at 10:45 AM.  Appropriate orders placed.  ADRYANNA FRIEDT was informed that the remainder of the evaluation will be completed by another provider, this initial triage assessment does not replace that evaluation, and the importance of remaining in the ED until their evaluation is complete.     Jacqlyn Larsen, PA-C 04/04/21 1053    Daleen Bo, MD 04/04/21 2056

## 2021-04-04 NOTE — ED Provider Notes (Signed)
[facility]    CSN: [id number] Arrival date & time: 04/04/21  1653      History   Chief Complaint Chief Complaint  Patient presents with   Shortness of Breath    HPI SCARLETTE HOGSTON is a 50 y.o. female.   Patient here today for evaluation of chest tightness and shortness of breath that started today.  She reports that the same thing happened recently.  She recently was told she needed to follow-up with cardiologist and is not sure if she is having anxiety due to same.  She has not had any chest pain.  She denies any congestion or cough.  She has not had any fever or chills.  She has not reported any treatment for symptoms.  She describes her shortness of breath as just not feeling like she can take a deep breath.  The history is provided by the patient.  Shortness of Breath Associated symptoms: no abdominal pain, no cough, no fever, no sore throat and no vomiting    Past Medical History:  Diagnosis Date   Depression    Gilbert's syndrome    Mitral regurgitation    UTI (lower urinary tract infection)     Patient Active Problem List   Diagnosis Date Noted   MVP (mitral valve prolapse)    Moderate mitral regurgitation 09/11/2018   SBE (subacute bacterial endocarditis) prophylaxis candidate 47/65/4650   Complicated UTI (urinary tract infection) 08/24/2013   Situational stress 03/03/2013   Abnormal uterine bleeding 01/29/2013   Anorexia nervosa 01/13/2013   Bacterial vaginosis 09/10/2012   Impacted cerumen of both ears 09/10/2012   Abdominal pain, unspecified site 09/10/2012    Past Surgical History:  Procedure Laterality Date   APPENDECTOMY     BUBBLE STUDY  09/23/2019   Procedure: BUBBLE STUDY;  Surgeon: Rex Kras, DO;  Location: [facility];  Service: Cardiovascular;;   CESAREAN SECTION     TEE WITHOUT CARDIOVERSION N/A 09/23/2019   Procedure: TRANSESOPHAGEAL ECHOCARDIOGRAM (TEE);  Surgeon: Rex Kras, DO;  Location: [facility];  Service:  Cardiovascular;  Laterality: N/A;   TONSILLECTOMY     UTERINE FIBROID SURGERY      OB History   No obstetric history on file.      Home Medications    Prior to Admission medications   Medication Sig Start Date End Date Taking? Authorizing Provider  Ascorbic Acid (VITAMIN C ER PO) Take 1 tablet by mouth daily.    [provider]  Biotin 1 MG CAPS Take by mouth.    [provider]  ergocalciferol (VITAMIN D2) 1.25 MG (50000 UT) capsule Take 1 capsule (50,000 Units total) by mouth once a week. 03/28/21   Elby Showers, MD  ibuprofen (ADVIL) 200 MG tablet Take 400 mg by mouth every 6 (six) hours as needed for moderate pain.    [provider]  Multiple Vitamin (MULTIVITAMIN) capsule Take 1 capsule by mouth daily.    [provider]  Zinc 50 MG CAPS Take 1 capsule by mouth daily.    [provider]    Family History Family History  Problem Relation Age of Onset   Diabetes Mother    Hyperlipidemia Mother    Diabetes Father    Lung cancer Father    Cancer Maternal Grandmother        breast   Diabetes Sister    Hyperlipidemia Brother     Social History Social History   Tobacco Use   Smoking status: Never  Smokeless tobacco: Never  Vaping Use   Vaping Use: Never used  Substance Use Topics   Alcohol use: Yes    Comment: occasional    Drug use: No     Allergies   Penicillins and Sulfa antibiotics   Review of Systems Review of Systems  Constitutional:  Negative for chills and fever.  HENT:  Negative for congestion and sore throat.   Eyes:  Negative for discharge and redness.  Respiratory:  Positive for shortness of breath. Negative for cough.   Gastrointestinal:  Negative for abdominal pain, nausea and vomiting.  Genitourinary:  Positive for vaginal bleeding and vaginal discharge.    Physical Exam Triage Vital Signs ED Triage Vitals [04/04/21 1743]  Enc Vitals Group     BP 133/63     Pulse Rate (!) 54     Resp  18     Temp 98.3 F (36.8 C)     Temp Source Oral     SpO2 99 %     Weight      Height      Head Circumference      Peak Flow      Pain Score 0     Pain Loc      Pain Edu?      Excl. in [place]?    No data found.  Updated Vital Signs BP 133/63 (BP Location: Left Arm)   Pulse (!) 54   Temp 98.3 F (36.8 C) (Oral)   Resp 18   LMP 03/28/2021   SpO2 99%      Physical Exam Vitals and nursing note reviewed.  Constitutional:      General: She is not in acute distress.    Appearance: Normal appearance. She is not ill-appearing.  HENT:     Head: Normocephalic and atraumatic.  Eyes:     Conjunctiva/sclera: Conjunctivae normal.  Cardiovascular:     Rate and Rhythm: Normal rate and regular rhythm.     Heart sounds: Normal heart sounds. No murmur heard. Pulmonary:     Effort: Pulmonary effort is normal. No respiratory distress.     Breath sounds: Normal breath sounds. No wheezing, rhonchi or rales.  Neurological:     Mental Status: She is alert.  Psychiatric:        Mood and Affect: Mood normal.        Behavior: Behavior normal.        Thought Content: Thought content normal.     UC Treatments / Results  Labs (all labs ordered are listed, but only abnormal results are displayed) Labs Reviewed - No data to display  EKG   Radiology DG Chest 2 View  Result Date: 04/04/2021 CLINICAL DATA:  Shortness of breath beginning today. EXAM: CHEST - 2 VIEW COMPARISON:  09/18/2016 FINDINGS: Heart size is normal. Mediastinal shadows are normal. The lungs are clear. No definite bronchial thickening. No infiltrate, collapse or effusion. Chronic scoliotic curvature of the spine, similar to the study of 2018. IMPRESSION: No active cardiopulmonary disease. Chronic scoliosis, similar to the study of 2018. Electronically Signed   By: Nelson Chimes M.D.   On: 04/04/2021 11:20    Procedures Procedures (including critical care time)  Medications Ordered in UC Medications - No data to  display  Initial Impression / Assessment and Plan / UC Course  I have reviewed the triage vital signs and the nursing notes.  Pertinent labs & imaging results that were available during my care of the patient were reviewed by me  and considered in my medical decision making (see chart for details).  Reassured patient that imaging and labs from the emergency department all look normal, vitals in urgent care without concerning findings.  Recommended follow-up with her cardiologist as discussed with her PCP.  Encouraged follow-up sooner with any further concerns.  Final Clinical Impressions(s) / UC Diagnoses   Final diagnoses:  Shortness of breath   Discharge Instructions   None    ED Prescriptions   None    PDMP not reviewed this encounter.   Francene Finders, PA-C 04/04/21 1853

## 2021-04-04 NOTE — ED Triage Notes (Addendum)
Pt c/o SOB and lightheaded this am while at work. States feels like she doesn't has enough air. Pt c/o center chest tightness. Pt in no distress, speaking in complete sentences. Denies chest pain. States had the same episode 2 days ago.

## 2021-04-12 ENCOUNTER — Other Ambulatory Visit: Payer: Self-pay

## 2021-04-12 ENCOUNTER — Encounter: Payer: Self-pay | Admitting: Cardiology

## 2021-04-12 ENCOUNTER — Ambulatory Visit: Payer: BC Managed Care – PPO | Admitting: Cardiology

## 2021-04-12 VITALS — BP 123/74 | HR 68 | Resp 16 | Ht 69.0 in | Wt 175.0 lb

## 2021-04-12 DIAGNOSIS — I341 Nonrheumatic mitral (valve) prolapse: Secondary | ICD-10-CM

## 2021-04-12 DIAGNOSIS — R072 Precordial pain: Secondary | ICD-10-CM | POA: Diagnosis not present

## 2021-04-12 DIAGNOSIS — R0602 Shortness of breath: Secondary | ICD-10-CM | POA: Diagnosis not present

## 2021-04-12 DIAGNOSIS — I340 Nonrheumatic mitral (valve) insufficiency: Secondary | ICD-10-CM

## 2021-04-12 NOTE — Progress Notes (Signed)
Marissa Lewis Date of Birth: 1971/05/28 MRN: [id number] Primary Care Provider:Baxley, Cresenciano Lick, MD Primary Cardiologist: Rex Kras, DO (established care 09/18/2019)  Date: 04/12/21 Last Office Visit: 09/29/2019   Chief Complaint  Patient presents with   Chest Pain   Follow-up   Shortness of Breath    HPI  Marissa Lewis is a 50 y.o.  female who presents to the office with a chief complaint of "chest tightness and shortness of breath"   Patient is accompanied with her husband Marissa Lewis at today's visit.  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  She is a pleasant follow-up in May 2022 for annual visit but failed to do so until today she presents with a chief complaint of chest tightness and the inability to take a deep breath.  Which prompted ER/urgent care visit.  Patient states that she was in her usual state of health until 04/04/2021 when she was having difficulty catching her breath she describes it as inability to take a deep breath.  Following day the symptoms persisted and therefore [facility] for further evaluation and management.  However due to prolonged wait times she left AMA and went to urgent care.  The work-up thus far noted high sensitive troponin negative x1, stable renal function and electrolytes, hemoglobin within normal limits.  Overall functional capacity is reduced due to decreased motivation.  She used to go to the gym 3 times a week and now goes to the gym twice a month.   FUNCTIONAL STATUS: Goes to the gym three times a week (mostly weights and cardio for 15 minutes).   ALLERGIES: Allergies  Allergen Reactions   Penicillins Rash   Sulfa Antibiotics Hives and Rash    MEDICATION LIST PRIOR TO VISIT: Current Outpatient Medications on File Prior to Visit  Medication Sig Dispense Refill   Ascorbic Acid (VITAMIN C ER PO) Take 1 tablet by mouth daily.     Biotin 1 MG CAPS  Take by mouth.     ergocalciferol (VITAMIN D2) 1.25 MG (50000 UT) capsule Take 1 capsule (50,000 Units total) by mouth once a week. 12 capsule 3   ibuprofen (ADVIL) 200 MG tablet Take 400 mg by mouth every 6 (six) hours as needed for moderate pain.     Multiple Vitamin (MULTIVITAMIN) capsule Take 1 capsule by mouth daily.     Vitamin D, Ergocalciferol, (DRISDOL) 1.25 MG (50000 UNIT) CAPS capsule Take 50,000 Units by mouth once a week. Wednesday     Zinc 50 MG CAPS Take 1 capsule by mouth daily.     No current facility-administered medications on file prior to visit.    PAST MEDICAL HISTORY: Past Medical History:  Diagnosis Date   Depression    Gilbert's syndrome    Mitral regurgitation    UTI (lower urinary tract infection)     PAST SURGICAL HISTORY: Past Surgical History:  Procedure Laterality Date   APPENDECTOMY     BUBBLE STUDY  09/23/2019   Procedure: BUBBLE STUDY;  Surgeon: Rex Kras, DO;  Location: [facility];  Service: Cardiovascular;;   CESAREAN SECTION     TEE WITHOUT CARDIOVERSION N/A 09/23/2019   Procedure: TRANSESOPHAGEAL ECHOCARDIOGRAM (TEE);  Surgeon: Rex Kras, DO;  Location: [facility];  Service: Cardiovascular;  Laterality: N/A;   TONSILLECTOMY     UTERINE FIBROID SURGERY      FAMILY HISTORY: The patient's family history includes Cancer in her maternal grandmother;  Diabetes in her father, mother, and sister; Hyperlipidemia in her brother and mother; Lung cancer in her father.   SOCIAL HISTORY:  The patient  reports that she has never smoked. She has never used smokeless tobacco. She reports current alcohol use. She reports that she does not use drugs.  Review of Systems  Constitutional: Negative for chills and fever.  HENT:  Negative for ear discharge, ear pain and nosebleeds.   Eyes:  Negative for blurred vision and discharge.  Cardiovascular:  Negative for chest pain, claudication, dyspnea on exertion, leg swelling, near-syncope, orthopnea,  palpitations, paroxysmal nocturnal dyspnea and syncope.  Respiratory:  Negative for cough and shortness of breath.   Endocrine: Negative for polydipsia, polyphagia and polyuria.  Hematologic/Lymphatic: Negative for bleeding problem.  Skin:  Negative for flushing and nail changes.  Musculoskeletal:  Negative for muscle cramps, muscle weakness and myalgias.  Gastrointestinal:  Negative for abdominal pain, dysphagia, hematemesis, hematochezia, melena, nausea and vomiting.  Neurological:  Negative for dizziness, focal weakness and light-headedness.  PHYSICAL EXAM: Vitals with BMI 04/12/2021 04/04/2021 04/04/2021  Height 5' 9" - -  Weight 175 lbs - -  BMI 46.50 - -  Systolic 354 656 812  Diastolic 74 63 71  Pulse 68 54 63    CONSTITUTIONAL: Well-developed and well-nourished. No acute distress.  SKIN: Skin is warm and dry. No rash noted. No cyanosis. No pallor. No jaundice HEAD: Normocephalic and atraumatic.  EYES: No scleral icterus MOUTH/THROAT: Moist oral membranes.  NECK: No JVD present. No thyromegaly noted. No carotid bruits  LYMPHATIC: No visible cervical adenopathy.  CHEST Normal respiratory effort. No intercostal retractions  LUNGS: Clear to auscultation bilaterally. No stridor. No wheezes. No rales.  CARDIOVASCULAR: Regular rate and rhythm, positive S1-S2, no murmurs rubs or gallops appreciated. ABDOMINAL: No apparent ascites.  EXTREMITIES: No peripheral edema  HEMATOLOGIC: No significant bruising. NEUROLOGIC: Oriented to person, place, and time. Nonfocal. Normal muscle tone.  PSYCHIATRIC: Normal mood and affect. Normal behavior. Cooperative  CARDIAC DATABASE: EKG: 04/12/2021: NSR, 63 bpm, poor R wave progression, nonspecific T wave abnormality, without underlying injury pattern.    Echocardiogram: 09/05/2018: LVEF 60%. 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 Moderate eccentric posteriorly directed  mitral regurgitation.  Normal right atrial pressure.  09/11/2019: Normal LV systolic function with EF 60%. Normal diastolic filling pattern. Myxomatous degeneration with bileaflet prolapse. Moderate (Grade III) posteriorly directed mitral regurgitation. Mild tricuspid regurgitation.  No evidence of pulmonary hypertension.   Transesophageal echocardiogram 09/23/2019: LVEF 60-65%, mild LVH, normal diastolic parameters, myxomatous mitral valve with bileaflet prolapse.  Multiple jets noted with moderate late peaking regurgitation.  No mitral stenosis.  Agitated saline study negative for inter atrial shunting.  Stress Testing:  Treadmill exercise stress test 08/11/2016: The patient exercised according to Bruce Protocol, Total time  recorded  6:23 min, achieving 101% of THR for age and 7.61 METS of work.  Stress terminated due to  fatigue and THR (>85% MPHR)/MPHR met. Normal BP response. There was no ST-T changes of ischemia with exercise stress test. There were no significant arrhythmias.  Normal BP response. No e/o ischemia by GXT. Exercise tolerence is  low normal for age .Continue Preventive therapy.  Heart Catheterization: None  LABORATORY DATA: CBC Latest Ref Rng & Units 04/04/2021 03/25/2021 03/22/2020  WBC 4.0 - 10.5 K/uL 5.6 5.9 6.9  Hemoglobin 12.0 - 15.0 g/dL 12.6 13.0 13.4  Hematocrit 36.0 - 46.0 % 36.9 38.5 39.1  Platelets 150 - 400 K/uL 270 306 264  CMP Latest Ref Rng & Units 04/04/2021 03/25/2021 03/22/2020  Glucose 70 - 99 mg/dL 103(H) 94 123(H)  BUN 6 - 20 mg/dL _0 Creatinine 0.44 - 1.00 mg/dL 0.91 0.91 1.11(H)  Sodium 135 - 145 mmol/L 137 139 139  Potassium 3.5 - 5.1 mmol/L 3.8 4.3 3.7  Chloride 98 - 111 mmol/L 107 108 105  CO2 22 - 32 mmol/L _1 Calcium 8.9 - 10.3 mg/dL 9.0 9.1 9.5  Total Protein 6.5 - 8.1 g/dL 6.6 6.6 6.9  Total Bilirubin 0.3 - 1.2 mg/dL 1.6(H) 1.8(H) 1.5(H)  Alkaline Phos 38 - 126 U/L 45 - -  AST 15 - 41 U/L _2 ALT 0 - 44 U/L _3 Lipid Panel      Component Value Date/Time   CHOL 193 03/25/2021 0923   TRIG 61 03/25/2021 0923   HDL 70 03/25/2021 0923   CHOLHDL 2.8 03/25/2021 0923   VLDL 9 09/09/2012 0936   LDLCALC 108 (H) 03/25/2021 0923    No results found for: HGBA1C No components found for: NTPROBNP Lab Results  Component Value Date   TSH 0.67 03/25/2021   TSH 1.44 03/22/2020   TSH 0.54 03/21/2019    Cardiac Panel (last 3 results) No results for input(s): CKTOTAL, CKMB, TROPONINIHS, RELINDX in the last 72 hours.  FINAL MEDICATION LIST END OF ENCOUNTER: No orders of the defined types were placed in this encounter.   There are no discontinued medications.   Current Outpatient Medications:    Ascorbic Acid (VITAMIN C ER PO), Take 1 tablet by mouth daily., Disp: , Rfl:    Biotin 1 MG CAPS, Take by mouth., Disp: , Rfl:    ergocalciferol (VITAMIN D2) 1.25 MG (50000 UT) capsule, Take 1 capsule (50,000 Units total) by mouth once a week., Disp: 12 capsule, Rfl: 3   ibuprofen (ADVIL) 200 MG tablet, Take 400 mg by mouth every 6 (six) hours as needed for moderate pain., Disp: , Rfl:    Multiple Vitamin (MULTIVITAMIN) capsule, Take 1 capsule by mouth daily., Disp: , Rfl:    Vitamin D, Ergocalciferol, (DRISDOL) 1.25 MG (50000 UNIT) CAPS capsule, Take 50,000 Units by mouth once a week. Wednesday, Disp: , Rfl:    Zinc 50 MG CAPS, Take 1 capsule by mouth daily., Disp: , Rfl:   IMPRESSION:    ICD-10-CM   1. Precordial pain  R07.2 EKG 12-Lead    PCV CARDIAC STRESS TEST    2. Shortness of breath  R06.02 PCV CARDIAC STRESS TEST    3. Mitral valve prolapse  I34.1     4. Moderate mitral regurgitation  I34.0        RECOMMENDATIONS: TANEAL SONNTAG is a 50 y.o. 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  Precordial pain Low pretest probability for cardiac etiology. EKG  does not illustrate myocardial injury pattern Echocardiogram will be ordered to evaluate for structural heart disease and left ventricular systolic function. Exercise treadmill stress test to evaluate for functional status and exercise-induced ischemia.  Mitral valve prolapse and moderate mitral regurgitation: Known history of myxomatous mitral valve with mitral valve prolapse and moderate MR. Patient was supposed to have a repeat echocardiogram in May 2023 to reevaluate the severity of MR. Will proceed with echocardiogram as discussed above.  Total encounter time 33 minutes. *Total Encounter Time as defined by  the Centers for Medicare and Medicaid Services includes, in addition to the face-to-face time of a patient visit (documented in the note above) non-face-to-face time: obtaining and reviewing outside history, ordering and reviewing medications, tests or procedures, care coordination (communications with other health care professionals or caregivers) and documentation in the medical record.  Reviewed records from her recent ER/urgent care visits, independently reviewed labs dating 04/04/2021.   Orders Placed This Encounter  Procedures   PCV CARDIAC STRESS TEST   EKG 12-Lead   --Continue cardiac medications as reconciled in final medication list. --Return in about 4 weeks (around 05/10/2021) for Follow up, Chest pain. Or sooner if needed. --Continue follow-up with your primary care physician regarding the management of your other chronic comorbid conditions.  Patient's questions and concerns were addressed to her satisfaction. She voices understanding of the instructions provided during this encounter.    This note was created using a voice recognition software as a result there may be grammatical errors inadvertently enclosed that do not reflect the nature of this encounter. Every attempt is made to correct such errors.  Rex Kras, [place], [facility]  Pager: [phone] Office:  [phone]

## 2021-05-06 ENCOUNTER — Ambulatory Visit: Payer: BC Managed Care – PPO

## 2021-05-06 ENCOUNTER — Other Ambulatory Visit: Payer: Self-pay

## 2021-05-06 DIAGNOSIS — R072 Precordial pain: Secondary | ICD-10-CM | POA: Diagnosis not present

## 2021-05-06 DIAGNOSIS — I341 Nonrheumatic mitral (valve) prolapse: Secondary | ICD-10-CM

## 2021-05-06 DIAGNOSIS — R0602 Shortness of breath: Secondary | ICD-10-CM | POA: Diagnosis not present

## 2021-05-06 DIAGNOSIS — I340 Nonrheumatic mitral (valve) insufficiency: Secondary | ICD-10-CM

## 2021-05-09 NOTE — Progress Notes (Signed)
Called pt no answer, left a vm

## 2021-05-09 NOTE — Progress Notes (Signed)
Patient called back and is aware to come in

## 2021-05-13 ENCOUNTER — Other Ambulatory Visit: Payer: Self-pay

## 2021-05-13 ENCOUNTER — Encounter: Payer: Self-pay | Admitting: Cardiology

## 2021-05-13 ENCOUNTER — Ambulatory Visit: Payer: BC Managed Care – PPO | Admitting: Cardiology

## 2021-05-13 VITALS — BP 129/71 | HR 62 | Resp 16 | Ht 69.0 in | Wt 172.0 lb

## 2021-05-13 DIAGNOSIS — Z712 Person consulting for explanation of examination or test findings: Secondary | ICD-10-CM | POA: Diagnosis not present

## 2021-05-13 DIAGNOSIS — I341 Nonrheumatic mitral (valve) prolapse: Secondary | ICD-10-CM | POA: Diagnosis not present

## 2021-05-13 DIAGNOSIS — I340 Nonrheumatic mitral (valve) insufficiency: Secondary | ICD-10-CM | POA: Diagnosis not present

## 2021-05-13 DIAGNOSIS — R072 Precordial pain: Secondary | ICD-10-CM

## 2021-05-13 NOTE — Progress Notes (Signed)
Marissa Lewis Date of Birth: 1971/05/14 MRN: [id number] Primary Care Provider:Baxley, Cresenciano Lick, MD Primary Cardiologist: Rex Kras, DO (established care 09/18/2019)  Date: 05/13/21 Last Office Visit: 04/12/2021  Chief Complaint  Patient presents with   Chest Pain   Follow-up    HPI  Marissa Lewis is a 50 y.o.  female who presents to the office with a chief complaint of "reevaluation of chest tightness and discuss test results"   Patient is accompanied with her husband Marissa Lewis at today's visit.  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  November 2022 patient had an episode of chest tightness and difficulty catching her breath had gone to ED for evaluation.  However due to prolonged wait time she left AMA and went to urgent care.  She had high sensitive troponin which is negative x1, stable renal function, electrolytes.  Patient presented to the office for evaluation on elective basis.  At the last visit the shared decision was to proceed with an echocardiogram to reevaluate the severity of MR and mitral valve prolapse and GXT to evaluate for functional status and exercise-induced ischemia.  Results reviewed with the patient and her husband in great detail at today's office visit and noted below for further reference.  Clinically she no longer is having difficulty with breathing or chest tightness at rest or with effort related activities.  Patient states that it may have been secondary to anxiety as she is working 2 jobs and does not have much time for herself/to work out.   FUNCTIONAL STATUS: Goes to the gym three times a week (mostly weights and cardio for 15 minutes).   ALLERGIES: Allergies  Allergen Reactions   Penicillins Rash   Sulfa Antibiotics Hives and Rash    MEDICATION LIST PRIOR TO VISIT: Current Outpatient Medications on File Prior to Visit  Medication Sig Dispense Refill   Ascorbic  Acid (VITAMIN C ER PO) Take 1 tablet by mouth daily.     Biotin 1 MG CAPS Take by mouth.     ergocalciferol (VITAMIN D2) 1.25 MG (50000 UT) capsule Take 1 capsule (50,000 Units total) by mouth once a week. 12 capsule 3   ibuprofen (ADVIL) 200 MG tablet Take 400 mg by mouth every 6 (six) hours as needed for moderate pain.     Multiple Vitamin (MULTIVITAMIN) capsule Take 1 capsule by mouth daily.     Vitamin D, Ergocalciferol, (DRISDOL) 1.25 MG (50000 UNIT) CAPS capsule Take 50,000 Units by mouth once a week. Wednesday     Zinc 50 MG CAPS Take 1 capsule by mouth daily.     No current facility-administered medications on file prior to visit.    PAST MEDICAL HISTORY: Past Medical History:  Diagnosis Date   Depression    Gilbert's syndrome    Mitral regurgitation    UTI (lower urinary tract infection)     PAST SURGICAL HISTORY: Past Surgical History:  Procedure Laterality Date   APPENDECTOMY     BUBBLE STUDY  09/23/2019   Procedure: BUBBLE STUDY;  Surgeon: Rex Kras, DO;  Location: [facility];  Service: Cardiovascular;;   CESAREAN SECTION     TEE WITHOUT CARDIOVERSION N/A 09/23/2019   Procedure: TRANSESOPHAGEAL ECHOCARDIOGRAM (TEE);  Surgeon: Rex Kras, DO;  Location: [facility];  Service: Cardiovascular;  Laterality: N/A;   TONSILLECTOMY     UTERINE FIBROID SURGERY      FAMILY HISTORY: The patient's family history includes Cancer in her  maternal grandmother; Diabetes in her father, mother, and sister; Hyperlipidemia in her brother and mother; Lung cancer in her father.   SOCIAL HISTORY:  The patient  reports that she has never smoked. She has never used smokeless tobacco. She reports current alcohol use. She reports that she does not use drugs.  Review of Systems  Constitutional: Negative for chills and fever.  HENT:  Negative for ear discharge, ear pain and nosebleeds.   Eyes:  Negative for blurred vision and discharge.  Cardiovascular:  Negative for chest pain,  claudication, dyspnea on exertion, leg swelling, near-syncope, orthopnea, palpitations, paroxysmal nocturnal dyspnea and syncope.  Respiratory:  Negative for cough and shortness of breath.   Endocrine: Negative for polydipsia, polyphagia and polyuria.  Hematologic/Lymphatic: Negative for bleeding problem.  Skin:  Negative for flushing and nail changes.  Musculoskeletal:  Negative for muscle cramps, muscle weakness and myalgias.  Gastrointestinal:  Negative for abdominal pain, dysphagia, hematemesis, hematochezia, melena, nausea and vomiting.  Neurological:  Negative for dizziness, focal weakness and light-headedness.   PHYSICAL EXAM: Vitals with BMI 05/13/2021 04/12/2021 04/04/2021  Height 5\' 9"  5\' 9"  -  Weight 172 lbs 175 lbs -  BMI 02.63 78.58 -  Systolic 850 277 412  Diastolic 71 74 63  Pulse 62 68 54    CONSTITUTIONAL: Well-developed and well-nourished. No acute distress.  SKIN: Skin is warm and dry. No rash noted. No cyanosis. No pallor. No jaundice HEAD: Normocephalic and atraumatic.  EYES: No scleral icterus MOUTH/THROAT: Moist oral membranes.  NECK: No JVD present. No thyromegaly noted. No carotid bruits  LYMPHATIC: No visible cervical adenopathy.  CHEST Normal respiratory effort. No intercostal retractions  LUNGS: Clear to auscultation bilaterally. No stridor. No wheezes. No rales.  CARDIOVASCULAR: Regular rate and rhythm, positive I7-O6, soft holosystolic murmur heard at the apex, radiates to the axilla, no rubs or gallops appreciated. ABDOMINAL: Soft, nontender, nondistended, positive bowel sounds in all 4 quadrants, no apparent ascites.  EXTREMITIES: No peripheral edema  HEMATOLOGIC: No significant bruising. NEUROLOGIC: Oriented to person, place, and time. Nonfocal. Normal muscle tone.  PSYCHIATRIC: Normal mood and affect. Normal behavior. Cooperative  CARDIAC DATABASE: EKG: 04/12/2021: NSR, 63 bpm, poor R wave progression, nonspecific T wave abnormality, without  underlying injury pattern.    Echocardiogram: 09/05/2018: LVEF 60%. Severe myxomatous MV, moderate eccentric, posteriorly directed  mitral regurgitation.   09/11/2019: LVEF 60%, Normal diastology, Myxomatous degeneration with bileaflet prolapse. Moderate (Grade III) posteriorly directed, mild TR.  Transesophageal echocardiogram 09/23/2019: LVEF 60-65%, mild LVH, normal diastolic parameters, myxomatous mitral valve with bileaflet prolapse.  Multiple jets noted with moderate late peaking regurgitation.  No mitral stenosis.  Agitated saline study negative for inter atrial shunting.  05/06/2021: Normal LV systolic function with visual EF 60-65%. Left ventricle cavity is normal in size. Normal left ventricular wall thickness. Normal global wall motion. Normal diastolic filling pattern, normal LAP. Left atrial cavity is mildly dilated. Native mitral valve, myxomatous degeneration, bilateral mild prolapse of the mitral valve leaflets. No evidence of mitral stenosis. Mild (Grade I) mitral regurgitation. Compared to study 09/11/2019 LVEF is preserved, Moderate MR is now mild, Mild TR is now resolved, otherwise no significant change.  Stress Testing:  Exercise treadmill stress test 05/06/2021: Functional status: Poor.  Chest pain: None.  Reason for stopping exercise: Fatigue/weakness.  Hypertensive response to exercise: No.  Exercise time 5 minutes 24 seconds on Bruce protocol, achieved 7.05 METS, 87% of age-predicted maximum heart rate.  Stress ECG positive for ischemia.  Poor functional capacity for  age, blunted blood pressure response to exercise, stress ST-T changes resolve within 1 minute into recovery, Duke treadmill score 0.4. Intermediate risk study. Clinical correlation required  Heart Catheterization: None  LABORATORY DATA: CBC Latest Ref Rng & Units 04/04/2021 03/25/2021 03/22/2020  WBC 4.0 - 10.5 K/uL 5.6 5.9 6.9  Hemoglobin 12.0 - 15.0 g/dL 12.6 13.0 13.4  Hematocrit 36.0 - 46.0 %  36.9 38.5 39.1  Platelets 150 - 400 K/uL 270 306 264    CMP Latest Ref Rng & Units 04/04/2021 03/25/2021 03/22/2020  Glucose 70 - 99 mg/dL 103(H) 94 123(H)  BUN 6 - 20 mg/dL 9 12 11   Creatinine 0.44 - 1.00 mg/dL 0.91 0.91 1.11(H)  Sodium 135 - 145 mmol/L 137 139 139  Potassium 3.5 - 5.1 mmol/L 3.8 4.3 3.7  Chloride 98 - 111 mmol/L 107 108 105  CO2 22 - 32 mmol/L 24 24 25   Calcium 8.9 - 10.3 mg/dL 9.0 9.1 9.5  Total Protein 6.5 - 8.1 g/dL 6.6 6.6 6.9  Total Bilirubin 0.3 - 1.2 mg/dL 1.6(H) 1.8(H) 1.5(H)  Alkaline Phos 38 - 126 U/L 45 - -  AST 15 - 41 U/L 18 12 15   ALT 0 - 44 U/L 16 9 12     Lipid Panel     Component Value Date/Time   CHOL 193 03/25/2021 0923   TRIG 61 03/25/2021 0923   HDL 70 03/25/2021 0923   CHOLHDL 2.8 03/25/2021 0923   VLDL 9 09/09/2012 0936   LDLCALC 108 (H) 03/25/2021 0923    No results found for: HGBA1C No components found for: NTPROBNP Lab Results  Component Value Date   TSH 0.67 03/25/2021   TSH 1.44 03/22/2020   TSH 0.54 03/21/2019    Cardiac Panel (last 3 results) No results for input(s): CKTOTAL, CKMB, TROPONINIHS, RELINDX in the last 72 hours.  FINAL MEDICATION LIST END OF ENCOUNTER: No orders of the defined types were placed in this encounter.   There are no discontinued medications.   Current Outpatient Medications:    Ascorbic Acid (VITAMIN C ER PO), Take 1 tablet by mouth daily., Disp: , Rfl:    Biotin 1 MG CAPS, Take by mouth., Disp: , Rfl:    ergocalciferol (VITAMIN D2) 1.25 MG (50000 UT) capsule, Take 1 capsule (50,000 Units total) by mouth once a week., Disp: 12 capsule, Rfl: 3   ibuprofen (ADVIL) 200 MG tablet, Take 400 mg by mouth every 6 (six) hours as needed for moderate pain., Disp: , Rfl:    Multiple Vitamin (MULTIVITAMIN) capsule, Take 1 capsule by mouth daily., Disp: , Rfl:    Vitamin D, Ergocalciferol, (DRISDOL) 1.25 MG (50000 UNIT) CAPS capsule, Take 50,000 Units by mouth once a week. Wednesday, Disp: , Rfl:    Zinc  50 MG CAPS, Take 1 capsule by mouth daily., Disp: , Rfl:   IMPRESSION:    ICD-10-CM   1. Moderate mitral regurgitation  I34.0 PCV ECHOCARDIOGRAM COMPLETE    2. Mitral valve prolapse  I34.1 PCV ECHOCARDIOGRAM COMPLETE    3. Precordial pain  R07.2     4. Encounter to discuss test results  Z71.2        RECOMMENDATIONS: LAEL WETHERBEE is a 50 y.o. 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  Mitral valve prolapse and moderate mitral regurgitation: Known history of myxomatous mitral valve with mitral valve prolapse and recent echocardiogram notes improvement in  severity of MR.  Recommend follow-up echo in 1 year to reevaluate disease progression.  Patient is educated on the importance of blood pressure management.  She is encouraged to increase physical activity as tolerated with a goal of 30 minutes a day 5 days a week.  Patient is asked to be cautious and avoid if possible isometric contractions, Valsalva maneuvers given her mitral valve pathology.  Precordial pain Asymptomatic. GXT noted to be intermediate risk due to her functional status and flat blood pressure response. We discussed proceeding with additional testing to evaluate for CAD and; however, shared decision was to monitor her for now and reevaluate in 6 months or sooner.  Patient is encouraged to seek medical attention if she has new onset or worsening precordial pain by going to the closest ER via EMS. Echocardiogram notes preserved LVEF, normal diastolic function, no obvious regional wall motion abnormalities.  Total encounter time 30 minutes.   Orders Placed This Encounter  Procedures   PCV ECHOCARDIOGRAM COMPLETE   --Continue cardiac medications as reconciled in final medication list. --Return in about 6 months (around 11/11/2021) for Follow up MVP / MR . Or sooner  if needed. --Continue follow-up with your primary care physician regarding the management of your other chronic comorbid conditions.  Patient's questions and concerns were addressed to her satisfaction. She voices understanding of the instructions provided during this encounter.    This note was created using a voice recognition software as a result there may be grammatical errors inadvertently enclosed that do not reflect the nature of this encounter. Every attempt is made to correct such errors.  Rex Kras, [place], [facility]  Pager: [phone] Office: [phone]

## 2021-05-16 ENCOUNTER — Telehealth: Payer: Self-pay | Admitting: Internal Medicine

## 2021-05-16 ENCOUNTER — Encounter: Payer: Self-pay | Admitting: Internal Medicine

## 2021-05-16 ENCOUNTER — Ambulatory Visit (INDEPENDENT_AMBULATORY_CARE_PROVIDER_SITE_OTHER): Payer: BC Managed Care – PPO | Admitting: Internal Medicine

## 2021-05-16 ENCOUNTER — Other Ambulatory Visit: Payer: Self-pay

## 2021-05-16 VITALS — HR 108 | Temp 99.7°F

## 2021-05-16 DIAGNOSIS — R52 Pain, unspecified: Secondary | ICD-10-CM

## 2021-05-16 DIAGNOSIS — J101 Influenza due to other identified influenza virus with other respiratory manifestations: Secondary | ICD-10-CM | POA: Diagnosis not present

## 2021-05-16 DIAGNOSIS — R509 Fever, unspecified: Secondary | ICD-10-CM

## 2021-05-16 LAB — POC COVID19 BINAXNOW: SARS Coronavirus 2 Ag: NEGATIVE

## 2021-05-16 LAB — POCT INFLUENZA A/B
Influenza A, POC: POSITIVE — AB
Influenza B, POC: NEGATIVE

## 2021-05-16 MED ORDER — OSELTAMIVIR PHOSPHATE 75 MG PO CAPS: 75.0000 mg | ORAL_CAPSULE | Freq: Two times a day (BID) | ORAL | 0 refills | Status: DC

## 2021-05-16 MED ORDER — HYDROCODONE BIT-HOMATROP MBR 5-1.5 MG/5ML PO SOLN: 5.0000 mL | Freq: Three times a day (TID) | ORAL | 0 refills | Status: DC | PRN

## 2021-05-16 NOTE — Progress Notes (Signed)
° °  Subjective:    Patient ID: Marissa Lewis, female    DOB: 06-Jun-1970, 50 y.o.   MRN: [id number]  HPI 50 year old Female seen with malaise and fatigue. Has not had influenza vaccine and no recent Covid vaccines on file.  Patient says that she started on Friday, December 16 with scratchy throat, cough, runny nose, myalgias.  COVID test yesterday was negative.  Her general health is excellent.  She has a history of mitral regurgitation and is followed by cardiologist.  History of tonsillectomy and adenoidectomy 1980.  Right breast adenoma removed in [place] in 1999.  History of cesarean section, uterine fibroid surgery, appendectomy, tonsillectomy and adenoidectomy.  Non-smoker.  Social alcohol consumption.  Social history: She has 2 children, married, works for the [employer].  Review of Systems see above-she looks fatigued     Objective:   Physical Exam Pulse is 108 and regular temperature 99.7 degrees pulse oximetry on room air 92%  TMs are slightly full bilaterally.  Pharynx slightly injected.  Neck is supple.  Chest clear to auscultation.  Rapid flu test is positive       Assessment & Plan:  Influenza A  Plan: Tamiflu 75 mg twice daily for 5 days.  Hycodan 1 teaspoon every 8 hours as needed for cough.  Rest and drink plenty of fluids.  Needs to quarantine at home until afebrile for 48 hours.  Note to be provided for work.  Hycodan 1 teaspoon every 8 hours as needed for cough.

## 2021-05-16 NOTE — Telephone Encounter (Signed)
scheduled

## 2021-05-16 NOTE — Telephone Encounter (Signed)
Marissa Lewis [id number]  Marissa Lewis called to say she started off on Friday with scratchy throat, now has cough, runny nose body aches and just does not feel good. COVID test was negative yesterday.

## 2021-05-18 ENCOUNTER — Ambulatory Visit
Admission: RE | Admit: 2021-05-18 | Discharge: 2021-05-18 | Disposition: A | Discharge status: Home / Self Care | Payer: BC Managed Care – PPO | Source: Ambulatory Visit | Attending: Internal Medicine | Admitting: Internal Medicine

## 2021-05-18 DIAGNOSIS — Z1231 Encounter for screening mammogram for malignant neoplasm of breast: Secondary | ICD-10-CM | POA: Diagnosis not present

## 2021-05-19 ENCOUNTER — Other Ambulatory Visit: Payer: Self-pay | Admitting: Internal Medicine

## 2021-05-19 DIAGNOSIS — R928 Other abnormal and inconclusive findings on diagnostic imaging of breast: Secondary | ICD-10-CM

## 2021-05-26 ENCOUNTER — Encounter: Payer: Self-pay | Admitting: Internal Medicine

## 2021-05-26 NOTE — Patient Instructions (Addendum)
Quarantine at home until afebrile for a minimum of 48 hours.  Take Tamiflu for 5 days as directed.  Hycodan 1 teaspoon every 8 hours as needed for cough. Stay well-hydrated.  Note to be provided for work.

## 2021-06-29 ENCOUNTER — Ambulatory Visit
Admission: RE | Admit: 2021-06-29 | Discharge: 2021-06-29 | Disposition: A | Discharge status: Home / Self Care | Payer: BC Managed Care – PPO | Source: Ambulatory Visit | Attending: Internal Medicine | Admitting: Internal Medicine

## 2021-06-29 ENCOUNTER — Other Ambulatory Visit: Payer: Self-pay | Admitting: Internal Medicine

## 2021-06-29 DIAGNOSIS — R928 Other abnormal and inconclusive findings on diagnostic imaging of breast: Secondary | ICD-10-CM

## 2021-06-29 DIAGNOSIS — R922 Inconclusive mammogram: Secondary | ICD-10-CM | POA: Diagnosis not present

## 2021-06-30 ENCOUNTER — Ambulatory Visit
Admission: RE | Admit: 2021-06-30 | Discharge: 2021-06-30 | Disposition: A | Discharge status: Home / Self Care | Payer: BC Managed Care – PPO | Source: Ambulatory Visit | Attending: Internal Medicine | Admitting: Internal Medicine

## 2021-06-30 DIAGNOSIS — R928 Other abnormal and inconclusive findings on diagnostic imaging of breast: Secondary | ICD-10-CM

## 2021-06-30 DIAGNOSIS — N62 Hypertrophy of breast: Secondary | ICD-10-CM | POA: Diagnosis not present

## 2021-07-19 ENCOUNTER — Ambulatory Visit: Payer: Self-pay | Admitting: General Surgery

## 2021-07-19 DIAGNOSIS — N6489 Other specified disorders of breast: Secondary | ICD-10-CM | POA: Diagnosis not present

## 2021-07-20 ENCOUNTER — Other Ambulatory Visit: Payer: Self-pay | Admitting: General Surgery

## 2021-07-20 DIAGNOSIS — N6489 Other specified disorders of breast: Secondary | ICD-10-CM

## 2021-08-01 ENCOUNTER — Encounter (HOSPITAL_BASED_OUTPATIENT_CLINIC_OR_DEPARTMENT_OTHER): Payer: Self-pay | Admitting: General Surgery

## 2021-08-01 ENCOUNTER — Other Ambulatory Visit: Payer: Self-pay

## 2021-08-10 ENCOUNTER — Ambulatory Visit
Admission: RE | Admit: 2021-08-10 | Discharge: 2021-08-10 | Disposition: A | Discharge status: Home / Self Care | Payer: BC Managed Care – PPO | Source: Ambulatory Visit | Attending: General Surgery | Admitting: General Surgery

## 2021-08-10 DIAGNOSIS — R928 Other abnormal and inconclusive findings on diagnostic imaging of breast: Secondary | ICD-10-CM | POA: Diagnosis not present

## 2021-08-10 DIAGNOSIS — N6489 Other specified disorders of breast: Secondary | ICD-10-CM

## 2021-08-10 MED ORDER — CHLORHEXIDINE GLUCONATE CLOTH 2 % EX PADS: 6.0000 | MEDICATED_PAD | Freq: Once | CUTANEOUS | Status: DC

## 2021-08-10 NOTE — Progress Notes (Signed)

## 2021-08-11 ENCOUNTER — Other Ambulatory Visit: Payer: Self-pay

## 2021-08-11 ENCOUNTER — Ambulatory Visit (HOSPITAL_BASED_OUTPATIENT_CLINIC_OR_DEPARTMENT_OTHER): Payer: BC Managed Care – PPO | Admitting: Anesthesiology

## 2021-08-11 ENCOUNTER — Encounter (HOSPITAL_BASED_OUTPATIENT_CLINIC_OR_DEPARTMENT_OTHER)
Admission: RE | Disposition: A | Discharge status: Home / Self Care | Payer: Self-pay | Source: Home / Self Care | Attending: General Surgery

## 2021-08-11 ENCOUNTER — Ambulatory Visit
Admission: RE | Admit: 2021-08-11 | Discharge: 2021-08-11 | Disposition: A | Discharge status: Home / Self Care | Payer: BC Managed Care – PPO | Source: Ambulatory Visit | Attending: General Surgery | Admitting: General Surgery

## 2021-08-11 ENCOUNTER — Encounter (HOSPITAL_BASED_OUTPATIENT_CLINIC_OR_DEPARTMENT_OTHER): Payer: Self-pay | Admitting: General Surgery

## 2021-08-11 ENCOUNTER — Ambulatory Visit (HOSPITAL_BASED_OUTPATIENT_CLINIC_OR_DEPARTMENT_OTHER)
Admission: RE | Admit: 2021-08-11 | Discharge: 2021-08-11 | Disposition: A | Discharge status: Home / Self Care | Payer: BC Managed Care – PPO | Attending: General Surgery | Admitting: General Surgery

## 2021-08-11 DIAGNOSIS — N6489 Other specified disorders of breast: Secondary | ICD-10-CM | POA: Insufficient documentation

## 2021-08-11 DIAGNOSIS — N6011 Diffuse cystic mastopathy of right breast: Secondary | ICD-10-CM | POA: Insufficient documentation

## 2021-08-11 DIAGNOSIS — N6081 Other benign mammary dysplasias of right breast: Secondary | ICD-10-CM | POA: Insufficient documentation

## 2021-08-11 DIAGNOSIS — R928 Other abnormal and inconclusive findings on diagnostic imaging of breast: Secondary | ICD-10-CM | POA: Diagnosis not present

## 2021-08-11 HISTORY — DX: Anxiety disorder, unspecified: F41.9

## 2021-08-11 HISTORY — PX: BREAST LUMPECTOMY WITH RADIOACTIVE SEED LOCALIZATION: SHX6424

## 2021-08-11 LAB — POCT PREGNANCY, URINE: Preg Test, Ur: NEGATIVE

## 2021-08-11 SURGERY — BREAST LUMPECTOMY WITH RADIOACTIVE SEED LOCALIZATION
Anesthesia: General | Site: Breast | Laterality: Right

## 2021-08-11 MED ORDER — AMISULPRIDE (ANTIEMETIC) 5 MG/2ML IV SOLN: 10.0000 mg | Freq: Once | INTRAVENOUS | Status: DC | PRN

## 2021-08-11 MED ORDER — BUPIVACAINE-EPINEPHRINE (PF) 0.25% -1:200000 IJ SOLN
INTRAMUSCULAR | Status: DC | PRN
  Administered 2021-08-11: 8 mL

## 2021-08-11 MED ORDER — FENTANYL CITRATE (PF) 100 MCG/2ML IJ SOLN
INTRAMUSCULAR | Status: AC
  Filled 2021-08-11: qty 2

## 2021-08-11 MED ORDER — MIDAZOLAM HCL 5 MG/5ML IJ SOLN
INTRAMUSCULAR | Status: DC | PRN
  Administered 2021-08-11: 2 mg via INTRAVENOUS

## 2021-08-11 MED ORDER — KETOROLAC TROMETHAMINE 30 MG/ML IJ SOLN: 30.0000 mg | Freq: Once | INTRAMUSCULAR | Status: DC | PRN

## 2021-08-11 MED ORDER — VANCOMYCIN HCL IN DEXTROSE 1-5 GM/200ML-% IV SOLN
1000.0000 mg | INTRAVENOUS | Status: AC
  Administered 2021-08-11: 1000 mg via INTRAVENOUS

## 2021-08-11 MED ORDER — GABAPENTIN 300 MG PO CAPS
300.0000 mg | ORAL_CAPSULE | ORAL | Status: AC
  Administered 2021-08-11: 300 mg via ORAL

## 2021-08-11 MED ORDER — ACETAMINOPHEN 500 MG PO TABS
1000.0000 mg | ORAL_TABLET | ORAL | Status: AC
  Administered 2021-08-11: 1000 mg via ORAL

## 2021-08-11 MED ORDER — DEXAMETHASONE SODIUM PHOSPHATE 4 MG/ML IJ SOLN
INTRAMUSCULAR | Status: DC | PRN
  Administered 2021-08-11: 4 mg via INTRAVENOUS

## 2021-08-11 MED ORDER — PROPOFOL 10 MG/ML IV BOLUS
INTRAVENOUS | Status: DC | PRN
Start: 2021-08-11 — End: 2021-08-11
  Administered 2021-08-11: 50 mg via INTRAVENOUS
  Administered 2021-08-11: 200 mg via INTRAVENOUS

## 2021-08-11 MED ORDER — VANCOMYCIN HCL IN DEXTROSE 1-5 GM/200ML-% IV SOLN
INTRAVENOUS | Status: AC
  Filled 2021-08-11: qty 200

## 2021-08-11 MED ORDER — LIDOCAINE HCL (CARDIAC) PF 100 MG/5ML IV SOSY
PREFILLED_SYRINGE | INTRAVENOUS | Status: DC | PRN
  Administered 2021-08-11: 60 mg via INTRAVENOUS

## 2021-08-11 MED ORDER — LIDOCAINE 2% (20 MG/ML) 5 ML SYRINGE
INTRAMUSCULAR | Status: DC | PRN
  Administered 2021-08-11: 4 mg via INTRAVENOUS

## 2021-08-11 MED ORDER — PROPOFOL 10 MG/ML IV BOLUS
INTRAVENOUS | Status: AC
  Filled 2021-08-11: qty 20

## 2021-08-11 MED ORDER — DEXAMETHASONE SODIUM PHOSPHATE 10 MG/ML IJ SOLN
INTRAMUSCULAR | Status: AC
  Filled 2021-08-11: qty 1

## 2021-08-11 MED ORDER — FENTANYL CITRATE (PF) 100 MCG/2ML IJ SOLN
INTRAMUSCULAR | Status: DC | PRN
  Administered 2021-08-11: 100 ug via INTRAVENOUS

## 2021-08-11 MED ORDER — OXYCODONE HCL 5 MG PO TABS: 5.0000 mg | ORAL_TABLET | Freq: Once | ORAL | Status: DC | PRN

## 2021-08-11 MED ORDER — ACETAMINOPHEN 500 MG PO TABS
ORAL_TABLET | ORAL | Status: AC
  Filled 2021-08-11: qty 2

## 2021-08-11 MED ORDER — OXYCODONE HCL 5 MG/5ML PO SOLN: 5.0000 mg | Freq: Once | ORAL | Status: DC | PRN

## 2021-08-11 MED ORDER — LACTATED RINGERS IV SOLN: INTRAVENOUS | Status: DC

## 2021-08-11 MED ORDER — GABAPENTIN 300 MG PO CAPS
ORAL_CAPSULE | ORAL | Status: AC
  Filled 2021-08-11: qty 1

## 2021-08-11 MED ORDER — FENTANYL CITRATE (PF) 100 MCG/2ML IJ SOLN: 25.0000 ug | INTRAMUSCULAR | Status: DC | PRN

## 2021-08-11 MED ORDER — OXYCODONE HCL 5 MG PO TABS: 5.0000 mg | ORAL_TABLET | Freq: Four times a day (QID) | ORAL | 0 refills | Status: DC | PRN

## 2021-08-11 MED ORDER — ONDANSETRON HCL 4 MG/2ML IJ SOLN
INTRAMUSCULAR | Status: AC
  Filled 2021-08-11: qty 2

## 2021-08-11 MED ORDER — MIDAZOLAM HCL 2 MG/2ML IJ SOLN
INTRAMUSCULAR | Status: AC
  Filled 2021-08-11: qty 2

## 2021-08-11 MED ORDER — EPHEDRINE SULFATE (PRESSORS) 50 MG/ML IJ SOLN
INTRAMUSCULAR | Status: DC | PRN
  Administered 2021-08-11: 10 mg via INTRAVENOUS

## 2021-08-11 SURGICAL SUPPLY — 35 items
ADH SKN CLS APL DERMABOND .7 (GAUZE/BANDAGES/DRESSINGS) ×1
APL PRP STRL LF DISP 70% ISPRP (MISCELLANEOUS) ×1
BLADE SURG 15 STRL LF DISP TIS (BLADE) ×2 IMPLANT
BLADE SURG 15 STRL SS (BLADE) ×2
CANISTER SUC SOCK COL 7IN (MISCELLANEOUS) ×3 IMPLANT
CANISTER SUCT 1200ML W/VALVE (MISCELLANEOUS) ×3 IMPLANT
CHLORAPREP W/TINT 26 (MISCELLANEOUS) ×3 IMPLANT
COVER BACK TABLE 60X90IN (DRAPES) ×3 IMPLANT
COVER MAYO STAND STRL (DRAPES) ×3 IMPLANT
COVER PROBE W GEL 5X96 (DRAPES) ×3 IMPLANT
DERMABOND ADVANCED (GAUZE/BANDAGES/DRESSINGS) ×1
DERMABOND ADVANCED .7 DNX12 (GAUZE/BANDAGES/DRESSINGS) ×2 IMPLANT
DRAPE LAPAROSCOPIC ABDOMINAL (DRAPES) ×3 IMPLANT
DRAPE UTILITY XL STRL (DRAPES) ×3 IMPLANT
ELECT COATED BLADE 2.86 ST (ELECTRODE) ×3 IMPLANT
ELECT REM PT RETURN 9FT ADLT (ELECTROSURGICAL) ×2
ELECTRODE REM PT RTRN 9FT ADLT (ELECTROSURGICAL) ×2 IMPLANT
GLOVE SURG ENC MOIS LTX SZ7.5 (GLOVE) ×6 IMPLANT
GLOVE SURG POLYISO LF SZ7 (GLOVE) ×1 IMPLANT
GLOVE SURG UNDER POLY LF SZ7 (GLOVE) ×1 IMPLANT
GOWN STRL REUS W/ TWL LRG LVL3 (GOWN DISPOSABLE) ×4 IMPLANT
GOWN STRL REUS W/TWL LRG LVL3 (GOWN DISPOSABLE) ×4
KIT MARKER MARGIN INK (KITS) ×3 IMPLANT
NDL HYPO 25X1 1.5 SAFETY (NEEDLE) IMPLANT
NEEDLE HYPO 25X1 1.5 SAFETY (NEEDLE) ×2 IMPLANT
PACK BASIN DAY SURGERY FS (CUSTOM PROCEDURE TRAY) ×3 IMPLANT
PENCIL SMOKE EVACUATOR (MISCELLANEOUS) ×3 IMPLANT
SLEEVE SCD COMPRESS KNEE MED (STOCKING) ×3 IMPLANT
SPONGE T-LAP 18X18 ~~LOC~~+RFID (SPONGE) ×3 IMPLANT
SUT MON AB 4-0 PC3 18 (SUTURE) ×3 IMPLANT
SUT VICRYL 3-0 CR8 SH (SUTURE) ×3 IMPLANT
SYR CONTROL 10ML LL (SYRINGE) ×1 IMPLANT
TOWEL GREEN STERILE FF (TOWEL DISPOSABLE) ×3 IMPLANT
TRAY FAXITRON CT DISP (TRAY / TRAY PROCEDURE) ×3 IMPLANT
TUBE CONNECTING 20X1/4 (TUBING) ×3 IMPLANT

## 2021-08-11 NOTE — Anesthesia Procedure Notes (Signed)
Procedure Name: LMA Insertion ?Date/Time: 08/11/2021 3:24 PM ?Performed by: Maryella Shivers, CRNA ?Pre-anesthesia Checklist: Patient identified, Emergency Drugs available, Suction available and Patient being monitored ?Patient Re-evaluated:Patient Re-evaluated prior to induction ?Oxygen Delivery Method: Circle system utilized ?Preoxygenation: Pre-oxygenation with 100% oxygen ?Induction Type: IV induction ?Ventilation: Mask ventilation without difficulty ?LMA: LMA inserted ?LMA Size: 4.0 ?Number of attempts: 1 ?Airway Equipment and Method: Bite block ?Placement Confirmation: positive ETCO2 ?Tube secured with: Tape ?Dental Injury: Teeth and Oropharynx as per pre-operative assessment  ? ? ? ? ?

## 2021-08-11 NOTE — Anesthesia Postprocedure Evaluation (Signed)
Anesthesia Post Note ? ?Patient: Marissa Lewis ? ?Procedure(s) Performed: RIGHT BREAST LUMPECTOMY WITH RADIOACTIVE SEED LOCALIZATION (Right: Breast) ? ?  ? ?Patient location during evaluation: PACU ?Anesthesia Type: General ?Level of consciousness: awake ?Pain management: pain level controlled ?Vital Signs Assessment: post-procedure vital signs reviewed and stable ?Respiratory status: spontaneous breathing, nonlabored ventilation, respiratory function stable and patient connected to nasal cannula oxygen ?Cardiovascular status: blood pressure returned to baseline and stable ?Postop Assessment: no apparent nausea or vomiting ?Anesthetic complications: no ? ? ?No notable events documented. ? ?Last Vitals:  ?Vitals:  ? 08/11/21 1630 08/11/21 1716  ?BP: (!) 98/56 125/83  ?Pulse: 76 66  ?Resp: 17 16  ?Temp:  36.7 ?C  ?SpO2: 99% 100%  ?  ?Last Pain:  ?Vitals:  ? 08/11/21 1716  ?TempSrc:   ?PainSc: 0-No pain  ? ? ?  ?  ?  ?  ?  ?  ? ?Bradford Cazier P Larry Knipp ? ? ? ? ?

## 2021-08-11 NOTE — H&P (Signed)
Marissa Lewis is an 50 y.o. female.   ?Chief Complaint: Right breast mass ?HPI: The patient is a 50-year-old black female who recently went for a routine screening mammogram.  At that time she was found to have a small distortion in the inner aspect of the right breast.  This was biopsied and came back as a complex sclerosing lesion.  She denies any significant family history of breast cancer.  She is otherwise in good health and does not smoke. ? ?Past Medical History:  ?Diagnosis Date  ? Anxiety   ? Depression   ? Gilbert's syndrome   ? Mitral regurgitation   ? UTI (lower urinary tract infection)   ? ? ?Past Surgical History:  ?Procedure Laterality Date  ? APPENDECTOMY    ? BUBBLE STUDY  09/23/2019  ? Procedure: BUBBLE STUDY;  Surgeon: Tolia, Sunit, DO;  Location: [facility];  Service: Cardiovascular;;  ? CESAREAN SECTION    ? TEE WITHOUT CARDIOVERSION N/A 09/23/2019  ? Procedure: TRANSESOPHAGEAL ECHOCARDIOGRAM (TEE);  Surgeon: Tolia, Sunit, DO;  Location: [facility];  Service: Cardiovascular;  Laterality: N/A;  ? TONSILLECTOMY    ? UTERINE FIBROID SURGERY    ? ? ?Family History  ?Problem Relation Age of Onset  ? Diabetes Mother   ? Hyperlipidemia Mother   ? Diabetes Father   ? Lung cancer Father   ? Cancer Maternal Grandmother   ?     breast  ? Diabetes Sister   ? Hyperlipidemia Brother   ? ?Social History:  reports that she has never smoked. She has never used smokeless tobacco. She reports current alcohol use. She reports that she does not use drugs. ? ?Allergies:  ?Allergies  ?Allergen Reactions  ? Penicillins Rash  ? Sulfa Antibiotics Hives and Rash  ? ? ?No medications prior to admission.  ? ? ?No results found for this or any previous visit (from the past 48 hour(s)). ?MM RT RADIOACTIVE SEED LOC MAMMO GUIDE ? ?Result Date: 08/10/2021 ?CLINICAL DATA:  Patient for preoperative localization prior to right breast excision. EXAM: MAMMOGRAPHIC GUIDED RADIOACTIVE SEED LOCALIZATION OF THE RIGHT BREAST  COMPARISON:  Previous exam(s). FINDINGS: Patient presents for radioactive seed localization prior to right breast excision. I met with the patient and we discussed the procedure of seed localization including benefits and alternatives. We discussed the high likelihood of a successful procedure. We discussed the risks of the procedure including infection, bleeding, tissue injury, implant rupture and further surgery. We discussed the low dose of radioactivity involved in the procedure. Informed, written consent was given. The usual time-out protocol was performed immediately prior to the procedure. Using mammographic guidance, sterile technique, 1% lidocaine and an I-125 radioactive seed, coil shaped clip was localized using a medial approach. The follow-up mammogram images confirm the seed in the expected location and were marked for Dr. Toth. Follow-up survey of the patient confirms presence of the radioactive seed. Order number of I-125 seed:  [id number]. Total activity:  0.239 millicuries reference Date: 07/25/2021 The patient tolerated the procedure well and [facility]. She was given instructions regarding seed removal. IMPRESSION: Radioactive seed localization right breast. No apparent complications. Electronically Signed   By: Drew  Davis M.D.   On: 08/10/2021 14:36  ? ?Review of Systems  ?All other systems reviewed and are negative. ? ?Height 5' 9" (1.753 m), weight 79 kg, last menstrual period 07/28/2021. ?Physical Exam ?Constitutional:   ?   Appearance: Normal appearance. She is normal weight.  ?HENT:  ?     Head: Normocephalic and atraumatic.  ?   Right Ear: External ear normal.  ?   Left Ear: External ear normal.  ?   Nose: Nose normal.  ?   Mouth/Throat:  ?   Mouth: Mucous membranes are moist.  ?   Pharynx: Oropharynx is clear.  ?Eyes:  ?   Extraocular Movements: Extraocular movements intact.  ?   Conjunctiva/sclera: Conjunctivae normal.  ?   Pupils: Pupils are equal, round, and  reactive to light.  ?Cardiovascular:  ?   Rate and Rhythm: Normal rate and regular rhythm.  ?   Pulses: Normal pulses.  ?   Heart sounds: Normal heart sounds.  ?Pulmonary:  ?   Effort: Pulmonary effort is normal. No respiratory distress.  ?   Breath sounds: Normal breath sounds.  ?Abdominal:  ?   General: Abdomen is flat. Bowel sounds are normal.  ?   Palpations: Abdomen is soft.  ?Musculoskeletal:     ?   General: No swelling or deformity. Normal range of motion.  ?   Cervical back: Normal range of motion and neck supple.  ?Skin: ?   General: Skin is warm and dry.  ?   Coloration: Skin is not jaundiced.  ?Neurological:  ?   General: No focal deficit present.  ?   Mental Status: She is alert and oriented to person, place, and time.  ?Psychiatric:     ?   Mood and Affect: Mood normal.     ?   Behavior: Behavior normal.     ?   Thought Content: Thought content normal.  ?  ? ?Assessment/Plan ?The patient appears to have a small area of complex sclerosing lesion in the inner aspect of the right breast.  Because of its abnormal appearance and because there is a 5 to 10% chance of missing something more significant the recommendation is to have this removed.  I have discussed with her in detail the risks and benefits of the operation as well as some of the technical aspects including the use of the radioactive seed for localization and she understands and wishes to proceed. ? ?Paul Toth III ?08/11/2021, 8:53 AM ? ?  ? ?

## 2021-08-11 NOTE — Transfer of Care (Signed)
Immediate Anesthesia Transfer of Care Note ? ?Patient: Marissa Lewis ? ?Procedure(s) Performed: RIGHT BREAST LUMPECTOMY WITH RADIOACTIVE SEED LOCALIZATION (Right: Breast) ? ?Patient Location: PACU ? ?Anesthesia Type:General ? ?Level of Consciousness: sedated ? ?Airway & Oxygen Therapy: Patient Spontanous Breathing and Patient connected to face mask oxygen ? ?Post-op Assessment: Report given to RN and Post -op Vital signs reviewed and stable ? ?Post vital signs: Reviewed and stable ? ?Last Vitals:  ?Vitals Value Taken Time  ?BP 102/65 08/11/21 1609  ?Temp 36.4 ?C 08/11/21 1609  ?Pulse 68 08/11/21 1609  ?Resp    ?SpO2 100 % 08/11/21 1609  ? ? ?Last Pain:  ?Vitals:  ? 08/11/21 1405  ?TempSrc: Oral  ?PainSc: 0-No pain  ?   ? ?Patients Stated Pain Goal: 3 (08/11/21 1405) ? ?Complications: No notable events documented. ?

## 2021-08-11 NOTE — Anesthesia Preprocedure Evaluation (Addendum)
Anesthesia Evaluation  ?Patient identified by MRN, date of birth, ID band ?Patient awake ? ? ? ?Reviewed: ?Allergy & Precautions, NPO status , Patient's Chart, lab work & pertinent test results ? ?Airway ?Mallampati: III ? ?TM Distance: >3 FB ?Neck ROM: Full ? ? ? Dental ? ?(+) Missing ?  ?Pulmonary ?neg pulmonary ROS,  ?  ?Pulmonary exam normal ? ? ? ? ? ? ? Cardiovascular ?negative cardio ROS ?Normal cardiovascular exam ? ? ?  ?Neuro/Psych ?PSYCHIATRIC DISORDERS Anxiety Depression negative neurological ROS ?   ? GI/Hepatic ?negative GI ROS, Gilbert's syndrome ?  ?Endo/Other  ?negative endocrine ROS ? Renal/GU ?negative Renal ROS  ? ?  ?Musculoskeletal ?negative musculoskeletal ROS ?(+)  ? Abdominal ?  ?Peds ? Hematology ?negative hematology ROS ?(+)   ?Anesthesia Other Findings ?RIGHT BREAST CSL ? Reproductive/Obstetrics ?hcg negative ? ?  ? ? ? ? ? ? ? ? ? ? ? ? ? ?  ?  ? ? ? ? ? ? ? ?Anesthesia Physical ?Anesthesia Plan ? ?ASA: 2 ? ?Anesthesia Plan: General  ? ?Post-op Pain Management:   ? ?Induction: Intravenous ? ?PONV Risk Score and Plan: 3 and Ondansetron, Dexamethasone, Midazolam and Treatment may vary due to age or medical condition ? ?Airway Management Planned: LMA ? ?Additional Equipment:  ? ?Intra-op Plan:  ? ?Post-operative Plan: Extubation in OR ? ?Informed Consent: I have reviewed the patients History and Physical, chart, labs and discussed the procedure including the risks, benefits and alternatives for the proposed anesthesia with the patient or authorized representative who has indicated his/her understanding and acceptance.  ? ? ? ?Dental advisory given ? ?Plan Discussed with: CRNA ? ?Anesthesia Plan Comments:   ? ? ? ? ? ? ?Anesthesia Quick Evaluation ? ?

## 2021-08-11 NOTE — Op Note (Signed)
08/11/2021 ? ?4:00 PM ? ?PATIENT:  Marissa Lewis  51 y.o. female ? ?PRE-OPERATIVE DIAGNOSIS:  RIGHT BREAST CSL ? ?POST-OPERATIVE DIAGNOSIS:  RIGHT BREAST CSL ? ?PROCEDURE:  Procedure(s): ?RIGHT BREAST LUMPECTOMY WITH RADIOACTIVE SEED LOCALIZATION (Right) ? ?SURGEON:  Surgeon(s) and Role: ?   Jovita Kussmaul, MD - Primary ? ?PHYSICIAN ASSISTANT:  ? ?ASSISTANTS: none  ? ?ANESTHESIA:   local and general ? ?EBL:  5 mL  ? ?BLOOD ADMINISTERED:none ? ?DRAINS: none  ? ?LOCAL MEDICATIONS USED:  MARCAINE    ? ?SPECIMEN:  Source of Specimen:  right breast tissue ? ?DISPOSITION OF SPECIMEN:  PATHOLOGY ? ?COUNTS:  YES ? ?TOURNIQUET:  * No tourniquets in log * ? ?DICTATION: .Dragon Dictation ? ?After informed consent was obtained the patient was brought to the operating room and placed in the supine position on the operating table.  After adequate induction of general anesthesia the patient's right breast was prepped with ChloraPrep, allowed to dry, and draped in usual sterile manner.  An appropriate timeout was performed.  Previously an I-125 seed was placed in the inner aspect of the right breast to mark an area of complex sclerosing lesion.  The neoprobe was set to I-125 in the area of radioactivity was readily identified in the inner aspect of the right breast.  The seed was very superficial to the skin.  The area around this was infiltrated with quarter percent Marcaine.  A curvilinear incision was made along the inner edge of the areola of the right breast with a 15 blade knife.  The incision was carried through the skin and subcutaneous tissue sharply with the electrocautery.  Dissection was then carried medially from the incision between the breast tissue and the subcutaneous fat and skin until the dissection was just beyond the area of the radioactive seed.  The seed was so shallow that we encountered the seed during this dissection.  The seed was placed in a cup and confirmed and sent to pathology.  The area beneath  the seed was then excised sharply with the electrocautery.  Once the tissue was removed the specimen was oriented with the appropriate paint colors.  A specimen radiograph was obtained that showed the clip to be in the center of the specimen.  The specimen was then sent to pathology for further evaluation.  Hemostasis was achieved using the Bovie electrocautery.  The wound was irrigated with saline and infiltrated with more quarter percent Marcaine.  The deep layer of the incision was closed with layers of interrupted 3-0 Vicryl stitches.  The skin was then closed with interrupted 4-0 Monocryl subcuticular stitches.  Dermabond dressings were applied.  The patient tolerated the procedure well.  At the end of the case all needle sponge and instrument counts were correct.  The patient was then awakened and taken recovery in stable condition.  Of note I did not encounter the implant or capsule during the dissection. ? ?PLAN OF CARE: Discharge to home after PACU ? ?PATIENT DISPOSITION:  PACU - hemodynamically stable. ?  ?Delay start of Pharmacological VTE agent (>24hrs) due to surgical blood loss or risk of bleeding: not applicable ? ?

## 2021-08-11 NOTE — Interval H&P Note (Signed)
History and Physical Interval Note: ? ?08/11/2021 ?2:51 PM ? ?Marissa Lewis  has presented today for surgery, with the diagnosis of RIGHT BREAST CSL.  The various methods of treatment have been discussed with the patient and family. After consideration of risks, benefits and other options for treatment, the patient has consented to  Procedure(s): ?RIGHT BREAST LUMPECTOMY WITH RADIOACTIVE SEED LOCALIZATION (Right) as a surgical intervention.  The patient's history has been reviewed, patient examined, no change in status, stable for surgery.  I have reviewed the patient's chart and labs.  Questions were answered to the patient's satisfaction.   ? ? ?Autumn Messing III ? ? ?

## 2021-08-11 NOTE — Discharge Instructions (Signed)
You may have Tylenol again tonight after 8pm, if needed. ? ? ?Post Anesthesia Home Care Instructions ? ?Activity: ?Get plenty of rest for the remainder of the day. A responsible individual must stay with you for 24 hours following the procedure.  ?For the next 24 hours, DO NOT: ?-Drive a car ?-Paediatric nurse ?-Drink alcoholic beverages ?-Take any medication unless instructed by your physician ?-Make any legal decisions or sign important papers. ? ?Meals: ?Start with liquid foods such as gelatin or soup. Progress to regular foods as tolerated. Avoid greasy, spicy, heavy foods. If nausea and/or vomiting occur, drink only clear liquids until the nausea and/or vomiting subsides. Call your physician if vomiting continues. ? ?Special Instructions/Symptoms: ?Your throat may feel dry or sore from the anesthesia or the breathing tube placed in your throat during surgery. If this causes discomfort, gargle with warm salt water. The discomfort should disappear within 24 hours. ? ?If you had a scopolamine patch placed behind your ear for the management of post- operative nausea and/or vomiting: ? ?1. The medication in the patch is effective for 72 hours, after which it should be removed.  Wrap patch in a tissue and discard in the trash. Wash hands thoroughly with soap and water. ?2. You may remove the patch earlier than 72 hours if you experience unpleasant side effects which may include dry mouth, dizziness or visual disturbances. ?3. Avoid touching the patch. Wash your hands with soap and water after contact with the patch. ?    ?

## 2021-08-12 ENCOUNTER — Encounter (HOSPITAL_BASED_OUTPATIENT_CLINIC_OR_DEPARTMENT_OTHER): Payer: Self-pay | Admitting: General Surgery

## 2021-08-15 ENCOUNTER — Ambulatory Visit: Payer: BC Managed Care – PPO | Admitting: Obstetrics and Gynecology

## 2021-08-15 LAB — SURGICAL PATHOLOGY

## 2021-08-31 ENCOUNTER — Encounter (HOSPITAL_COMMUNITY): Payer: Self-pay

## 2021-10-16 ENCOUNTER — Encounter: Payer: Self-pay | Admitting: Emergency Medicine

## 2021-10-16 ENCOUNTER — Ambulatory Visit
Admission: EM | Admit: 2021-10-16 | Discharge: 2021-10-16 | Disposition: A | Discharge status: Home / Self Care | Payer: BC Managed Care – PPO | Attending: Internal Medicine | Admitting: Internal Medicine

## 2021-10-16 DIAGNOSIS — H6121 Impacted cerumen, right ear: Secondary | ICD-10-CM | POA: Diagnosis not present

## 2021-10-16 NOTE — ED Triage Notes (Signed)
Patient c/o right ear pain x 1 week.  Tried cleaning ears with peroxide, now having pain.

## 2021-10-16 NOTE — ED Provider Notes (Signed)
[facility]    CSN: [id number] Arrival date & time: 10/16/21  1354      History   Chief Complaint Chief Complaint  Patient presents with   Otalgia    HPI Marissa Lewis is a 51 y.o. female.   Patient presents with right ear discomfort and feeling of "ear fullness" that started approximately 1 week ago.  She was having some discomfort in her left ear but reports that this has mainly improved.  She has tried cleaning her ears out with peroxide as well as earwax removal kits over-the-counter with minimal improvement.  Denies any associated upper respiratory symptoms or fever.  Denies trauma, foreign body, decreased hearing, drainage from the ear.   Otalgia  Past Medical History:  Diagnosis Date   Anxiety    Depression    Gilbert's syndrome    Mitral regurgitation    UTI (lower urinary tract infection)     Patient Active Problem List   Diagnosis Date Noted   MVP (mitral valve prolapse)    Moderate mitral regurgitation 09/11/2018   SBE (subacute bacterial endocarditis) prophylaxis candidate 20/25/4270   Complicated UTI (urinary tract infection) 08/24/2013   Situational stress 03/03/2013   Abnormal uterine bleeding 01/29/2013   Anorexia nervosa 01/13/2013   Bacterial vaginosis 09/10/2012   Impacted cerumen of both ears 09/10/2012   Abdominal pain, unspecified site 09/10/2012    Past Surgical History:  Procedure Laterality Date   APPENDECTOMY     BREAST LUMPECTOMY WITH RADIOACTIVE SEED LOCALIZATION Right 08/11/2021   Procedure: RIGHT BREAST LUMPECTOMY WITH RADIOACTIVE SEED LOCALIZATION;  Surgeon: Jovita Kussmaul, MD;  Location: [facility];  Service: General;  Laterality: Right;   BUBBLE STUDY  09/23/2019   Procedure: BUBBLE STUDY;  Surgeon: Rex Kras, DO;  Location: [facility];  Service: Cardiovascular;;   CESAREAN SECTION     TEE WITHOUT CARDIOVERSION N/A 09/23/2019   Procedure: TRANSESOPHAGEAL ECHOCARDIOGRAM (TEE);  Surgeon: Rex Kras, DO;  Location: [facility];  Service: Cardiovascular;  Laterality: N/A;   TONSILLECTOMY     UTERINE FIBROID SURGERY      OB History   No obstetric history on file.      Home Medications    Prior to Admission medications   Medication Sig Start Date End Date Taking? Authorizing Provider  Ascorbic Acid (VITAMIN C ER PO) Take 1 tablet by mouth daily.    [provider]  Biotin 1 MG CAPS Take by mouth.    [provider]  ergocalciferol (VITAMIN D2) 1.25 MG (50000 UT) capsule Take 1 capsule (50,000 Units total) by mouth once a week. 03/28/21   Elby Showers, MD  ibuprofen (ADVIL) 200 MG tablet Take 400 mg by mouth every 6 (six) hours as needed for moderate pain.    [provider]  Multiple Vitamin (MULTIVITAMIN) capsule Take 1 capsule by mouth daily.    [provider]  oxyCODONE (ROXICODONE) 5 MG immediate release tablet Take 1 tablet (5 mg total) by mouth every 6 (six) hours as needed for severe pain. 08/11/21   Jovita Kussmaul, MD  Vitamin D, Ergocalciferol, (DRISDOL) 1.25 MG (50000 UNIT) CAPS capsule Take 50,000 Units by mouth once a week. Wednesday    [provider]  Zinc 50 MG CAPS Take 1 capsule by mouth daily.    [provider]    Family History Family History  Problem Relation Age of Onset   Diabetes Mother    Hyperlipidemia Mother    Diabetes  Father    Lung cancer Father    Cancer Maternal Grandmother        breast   Diabetes Sister    Hyperlipidemia Brother     Social History Social History   Tobacco Use   Smoking status: Never   Smokeless tobacco: Never  Vaping Use   Vaping Use: Never used  Substance Use Topics   Alcohol use: Yes    Comment: occasional    Drug use: No     Allergies   Other, Penicillins, and Sulfa antibiotics   Review of Systems Review of Systems Per HPI  Physical Exam Triage Vital Signs ED Triage Vitals  Enc Vitals Group     BP 10/16/21 1412 120/75     Pulse Rate  10/16/21 1412 (!) 54     Resp 10/16/21 1412 18     Temp 10/16/21 1412 98.1 F (36.7 C)     Temp Source 10/16/21 1412 Oral     SpO2 10/16/21 1412 98 %     Weight --      Height --      Head Circumference --      Peak Flow --      Pain Score 10/16/21 1413 4     Pain Loc --      Pain Edu? --      Excl. in [place]? --    No data found.  Updated Vital Signs BP 120/75 (BP Location: Left Arm)   Pulse (!) 54   Temp 98.1 F (36.7 C) (Oral)   Resp 18   SpO2 98%   Visual Acuity Right Eye Distance:   Left Eye Distance:   Bilateral Distance:    Right Eye Near:   Left Eye Near:    Bilateral Near:     Physical Exam Constitutional:      General: She is not in acute distress.    Appearance: Normal appearance. She is not toxic-appearing or diaphoretic.  HENT:     Head: Normocephalic and atraumatic.     Right Ear: No drainage, swelling or tenderness. No middle ear effusion. There is impacted cerumen. No mastoid tenderness.     Left Ear: No drainage, swelling or tenderness.  No middle ear effusion. There is no impacted cerumen. No foreign body. No mastoid tenderness.     Ears:     Comments: Impacted cerumen to right tympanic membrane.  Minimal earwax in left external canal but not impacted.  Left tympanic membrane appears normal and intact.  Unable to visualize right tympanic membrane. Eyes:     Extraocular Movements: Extraocular movements intact.     Conjunctiva/sclera: Conjunctivae normal.  Pulmonary:     Effort: Pulmonary effort is normal.  Neurological:     General: No focal deficit present.     Mental Status: She is alert and oriented to person, place, and time. Mental status is at baseline.  Psychiatric:        Mood and Affect: Mood normal.        Behavior: Behavior normal.        Thought Content: Thought content normal.        Judgment: Judgment normal.     UC Treatments / Results  Labs (all labs ordered are listed, but only abnormal results are displayed) Labs Reviewed -  No data to display  EKG   Radiology No results found.  Procedures Procedures (including critical care time)  Medications Ordered in UC Medications - No data to display  Initial Impression /  Assessment and Plan / UC Course  I have reviewed the triage vital signs and the nursing notes.  Pertinent labs & imaging results that were available during my care of the patient were reviewed by me and considered in my medical decision making (see chart for details).     Patient requested both ears to be irrigated.  Bilateral ear irrigation was completed.  Successful removal of left cerumen.  Was not successful in completely removing cerumen in right external canal.  Patient was advised to use Debrox over-the-counter and to follow-up with provided contact information for ear, nose, throat specialist for further evaluation and management.  No obvious signs of infection on exam.  Patient verbalized understanding and was agreeable with plan. Final Clinical Impressions(s) / UC Diagnoses   Final diagnoses:  Impacted cerumen of right ear     Discharge Instructions      Please get Debrox over-the-counter and use this.  Follow-up with ear, nose, throat specialist at provided contact information if symptoms persist or worsen after this.     ED Prescriptions   None    PDMP not reviewed this encounter.   Teodora Medici, [place] 10/16/21 [phone]

## 2021-10-16 NOTE — Discharge Instructions (Signed)
Please get Debrox over-the-counter and use this.  Follow-up with ear, nose, throat specialist at provided contact information if symptoms persist or worsen after this.

## 2021-11-11 ENCOUNTER — Ambulatory Visit: Payer: BC Managed Care – PPO | Admitting: Cardiology

## 2022-05-12 ENCOUNTER — Ambulatory Visit: Payer: BC Managed Care – PPO

## 2022-05-12 DIAGNOSIS — I340 Nonrheumatic mitral (valve) insufficiency: Secondary | ICD-10-CM | POA: Diagnosis not present

## 2022-05-12 DIAGNOSIS — I341 Nonrheumatic mitral (valve) prolapse: Secondary | ICD-10-CM | POA: Diagnosis not present

## 2022-05-15 NOTE — Progress Notes (Signed)
Called and spoke with patient regarding her recent echocardiogram results. Call was transferred to ([address] [facility]) to schedule appointement.

## 2022-05-15 NOTE — Progress Notes (Signed)
Called patient, NA, LMAM

## 2022-06-17 DIAGNOSIS — R55 Syncope and collapse: Secondary | ICD-10-CM | POA: Diagnosis not present

## 2022-06-19 ENCOUNTER — Telehealth: Payer: Self-pay

## 2022-06-19 ENCOUNTER — Ambulatory Visit (INDEPENDENT_AMBULATORY_CARE_PROVIDER_SITE_OTHER): Payer: BC Managed Care – PPO | Admitting: Internal Medicine

## 2022-06-19 ENCOUNTER — Encounter: Payer: Self-pay | Admitting: Internal Medicine

## 2022-06-19 ENCOUNTER — Telehealth: Payer: Self-pay | Admitting: Internal Medicine

## 2022-06-19 VITALS — BP 118/70 | HR 66 | Temp 98.6°F | Ht 69.0 in | Wt 170.8 lb

## 2022-06-19 DIAGNOSIS — Z13220 Encounter for screening for lipoid disorders: Secondary | ICD-10-CM

## 2022-06-19 DIAGNOSIS — E611 Iron deficiency: Secondary | ICD-10-CM | POA: Diagnosis not present

## 2022-06-19 DIAGNOSIS — Z136 Encounter for screening for cardiovascular disorders: Secondary | ICD-10-CM | POA: Diagnosis not present

## 2022-06-19 DIAGNOSIS — R5383 Other fatigue: Secondary | ICD-10-CM | POA: Diagnosis not present

## 2022-06-19 DIAGNOSIS — R55 Syncope and collapse: Secondary | ICD-10-CM | POA: Diagnosis not present

## 2022-06-19 NOTE — Telephone Encounter (Signed)
Marissa Lewis [phone]  [place] called to say she was feeling very hot at work and then fainted at work on Saturday and then EMS was called, they done an EKG and her vitals was okay, she refused to go with them. She would liked to come in and see you to discuss what might be going on. She did say that she was sweating afterwards and cramping and then the next day she started with a heavy period. She has also been stressed at her full time job.

## 2022-06-19 NOTE — Progress Notes (Addendum)
Subjective:    Patient ID: Marissa Lewis , female    DOB: 03/16/1971, 52 y.o.    MRN: [id number]   52 y.o. female presents today for : Recent syncopal episode  Patient reports an episode of syncope while at work on 06/17/22. She states that she standing at the [employer] while at work at [employer], where she works part-time. While talking to a co-worker about her stressful work week at her full-time job, she began to feel very hot and unwell so she sat down in the break room. She leaned her head forward over a table before losing consciousness. When she came to she felt tingling in her extremities and was confused only briefly. After EMS arrived they found her vitals and EKG to be stable. She did not go to the ED for further evaluation at that time.  She reports episodes of feeling near syncopal and hot in the past. Her husband feels that this is related to her anxiety as her symptoms are typically preceded by stressful events. He recalls an episode that occurred after she became nervous about a ride at an amusement park.  Patient reminds [place] that she had a syncopal event with similar precipitating symptoms while having a mammogram. She states that she had been very nervous about the results.  She is followed by cardiology Dr. Terri Skains and last saw them in 04/2021. She plans to see Dr. Terri Skains again in June 2024.       Review of Systems  Constitutional:  Negative for fever and malaise/fatigue.  HENT:  Negative for congestion.   Eyes:  Negative for blurred vision.  Respiratory:  Negative for cough and shortness of breath.   Cardiovascular:  Negative for chest pain, palpitations and leg swelling.  Gastrointestinal:  Negative for vomiting.  Musculoskeletal:  Negative for back pain.  Skin:  Negative for rash.  Neurological:  Positive for loss of consciousness. Negative for headaches.  Psychiatric/Behavioral:  The patient is nervous/anxious.         Objective:   Vitals:   06/19/22 1133   BP: 118/70  Pulse: 66  Temp: 98.6 F (37 C)  SpO2: 98%     Physical Exam Vitals and nursing note reviewed.  Constitutional:      General: She is not in acute distress.    Appearance: Normal appearance. She is not ill-appearing or toxic-appearing.  HENT:     Head: Normocephalic and atraumatic.     Mouth/Throat:     Mouth: Mucous membranes are moist.     Pharynx: Oropharynx is clear.  Eyes:     General: No visual field deficit.    Extraocular Movements: Extraocular movements intact.     Pupils: Pupils are equal, round, and reactive to light.     Visual Fields: Right eye visual fields normal and left eye visual fields normal.  Neck:     Thyroid: No thyroid mass, thyromegaly or thyroid tenderness.     Vascular: No carotid bruit.  Cardiovascular:     Rate and Rhythm: Normal rate and regular rhythm.     Pulses: Normal pulses.     Heart sounds: Normal heart sounds.     Comments: Split S2 Pulmonary:     Effort: Pulmonary effort is normal.     Breath sounds: Normal breath sounds.  Musculoskeletal:     Cervical back: Normal range of motion.     Right lower leg: No edema.     Left lower leg: No edema.  Lymphadenopathy:  Cervical: No cervical adenopathy.  Neurological:     General: No focal deficit present.     Mental Status: She is alert and oriented to person, place, and time. Mental status is at baseline.     Cranial Nerves: Cranial nerves 2-12 are intact.     Sensory: Sensation is intact.     Motor: Motor function is intact. No weakness.     Coordination: Coordination is intact.     Gait: Gait is intact.     Deep Tendon Reflexes: Reflexes are normal and symmetric.     Reflex Scores:      Bicep reflexes are 2+ on the right side and 2+ on the left side.      Brachioradialis reflexes are 2+ on the right side and 2+ on the left side.      Patellar reflexes are 2+ on the right side and 2+ on the left side. Psychiatric:        Mood and Affect: Mood normal.         Behavior: Behavior normal.        Thought Content: Thought content normal.        Judgment: Judgment normal.     Studies personally reviewed by me:   EKG:  06/19/22: The ekg ordered today demonstrates  unchanged from previous tracings, normal sinus rhythm, nonspecific ST and T waves changes.  04/12/2021: NSR, 63 bpm, poor R wave progression, nonspecific T wave abnormality, without underlying injury pattern.     Echocardiogram: 09/05/2018: LVEF 60%. 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 Moderate eccentric posteriorly directed  mitral regurgitation. Normal right atrial pressure. 09/11/2019:  Normal LV systolic function with EF 60%. Normal diastolic filling pattern. Myxomatous degeneration with bileaflet prolapse. Moderate (Grade III) posteriorly directed mitral regurgitation. Mild tricuspid regurgitation.  No evidence of pulmonary hypertension.  05/12/2022:  Left ventricle cavity is normal in size. Mild concentric hypertrophy of  the left ventricle. Normal global wall motion. Normal LV systolic function  with EF 67%. Normal diastolic filling pattern.  Left atrial cavity is mildly dilated.  Myxomatous mitral valve with bileaflet prolapse. Mild (Grade I) mitral  regurgitation.  Mild tricuspid regurgitation.  No evidence of pulmonary hypertension.  No significant change compared to previous study on 05/06/2021.    TEE 09/23/2019: LVEF 60-65%, mild LVH, normal diastolic parameters, myxomatous mitral valve with bileaflet prolapse.  Multiple jets noted with moderate late peaking regurgitation.  No mitral stenosis.  Agitated saline study negative for inter atrial shunting.   Treadmill exercise stress test 08/11/2016: The patient exercised according to Bruce Protocol, Total time  recorded  6:23 min, achieving 101% of THR for age and 7.61 METS of work.  Stress terminated due to  fatigue and THR (>85% MPHR)/MPHR met. Normal BP response. There was  no ST-T changes of ischemia with exercise stress test. There were no significant arrhythmias.  Normal BP response. No e/o ischemia by GXT. Exercise tolerence is  low normal for age .Continue Preventive therapy.     Assessment & Plan:   Vasovagal syncope: We reviewed that her symptoms are most consistent with vasovagal syncope and discussed techniques to minimize syncopal events. Will order labs for complete work-up. EKG was ordered today and personally reviewed. EKG was WNL.    I,Alexis Herring,acting as a Education administrator for Elby Showers, MD.,have documented all relevant documentation on the behalf of Elby Showers, MD,as directed by  Elby Showers, MD while in the presence of Elby Showers,  MD.   Essie Christine, MD, have reviewed all documentation for this visit. The documentation on 06/25/22 for the exam, diagnosis, procedures, and orders are all accurate and complete.

## 2022-06-19 NOTE — Telephone Encounter (Signed)
Lying 106/80, 65 Sitting 118/70, 66 Standing 110/82, 68

## 2022-06-19 NOTE — Telephone Encounter (Signed)
I called and had her come in at 11:30

## 2022-06-20 LAB — COMPLETE METABOLIC PANEL WITH GFR
AG Ratio: 1.9 (calc) (ref 1.0–2.5)
ALT: 9 U/L (ref 6–29)
AST: 13 U/L (ref 10–35)
Albumin: 4.5 g/dL (ref 3.6–5.1)
Alkaline phosphatase (APISO): 44 U/L (ref 31–125)
BUN: 11 mg/dL (ref 7–25)
CO2: 27 mmol/L (ref 20–32)
Calcium: 9.4 mg/dL (ref 8.6–10.2)
Chloride: 108 mmol/L (ref 98–110)
Creat: 0.87 mg/dL (ref 0.50–0.99)
Globulin: 2.4 g/dL (calc) (ref 1.9–3.7)
Glucose, Bld: 90 mg/dL (ref 65–99)
Potassium: 4.3 mmol/L (ref 3.5–5.3)
Sodium: 142 mmol/L (ref 135–146)
Total Bilirubin: 1.7 mg/dL — ABNORMAL HIGH (ref 0.2–1.2)
Total Protein: 6.9 g/dL (ref 6.1–8.1)
eGFR: 82 mL/min/{1.73_m2} (ref >=60)

## 2022-06-20 LAB — CBC WITH DIFFERENTIAL/PLATELET
Absolute Monocytes: 291 cells/uL (ref 200–950)
Basophils Absolute: 28 cells/uL (ref 0–200)
Basophils Relative: 0.5 %
Eosinophils Absolute: 62 cells/uL (ref 15–500)
Eosinophils Relative: 1.1 %
HCT: 36.2 % (ref 35.0–45.0)
Hemoglobin: 12.5 g/dL (ref 11.7–15.5)
Lymphs Abs: 2055 cells/uL (ref 850–3900)
MCH: 30.9 pg (ref 27.0–33.0)
MCHC: 34.5 g/dL (ref 32.0–36.0)
MCV: 89.6 fL (ref 80.0–100.0)
MPV: 10 fL (ref 7.5–12.5)
Monocytes Relative: 5.2 %
Neutro Abs: 3164 cells/uL (ref 1500–7800)
Neutrophils Relative %: 56.5 %
Platelets: 298 10*3/uL (ref 140–400)
RBC: 4.04 10*6/uL (ref 3.80–5.10)
RDW: 13 % (ref 11.0–15.0)
Total Lymphocyte: 36.7 %
WBC: 5.6 10*3/uL (ref 3.8–10.8)

## 2022-06-20 LAB — LIPID PANEL
Cholesterol: 190 mg/dL (ref <200)
HDL: 68 mg/dL (ref >=50)
LDL Cholesterol (Calc): 106 mg/dL (calc) — ABNORMAL HIGH
Non-HDL Cholesterol (Calc): 122 mg/dL (calc) (ref <130)
Total CHOL/HDL Ratio: 2.8 (calc) (ref <5.0)
Triglycerides: 75 mg/dL (ref <150)

## 2022-06-20 LAB — TSH: TSH: 0.46 mIU/L

## 2022-06-20 LAB — IRON,TIBC AND FERRITIN PANEL
%SAT: 10 % (calc) — ABNORMAL LOW (ref 16–45)
Ferritin: 15 ng/mL — ABNORMAL LOW (ref 16–232)
Iron: 37 ug/dL — ABNORMAL LOW (ref 40–190)
TIBC: 364 mcg/dL (calc) (ref 250–450)

## 2022-06-20 LAB — T4, FREE: Free T4: 1 ng/dL (ref 0.8–1.8)

## 2022-06-25 NOTE — Progress Notes (Signed)
IElby Showers, MD, have reviewed all documentation for this visit. The documentation on 06/25/22 for the exam, diagnosis, procedures, and orders are all accurate and complete.

## 2022-07-28 ENCOUNTER — Telehealth: Payer: Self-pay | Admitting: Internal Medicine

## 2022-07-28 NOTE — Telephone Encounter (Signed)
[facility] [phone]  Marissa Lewis called to say that this morning she had blood in her stool and when she wiped, she also said that it was a little uncomfortable, but she does not feel she was straining.

## 2022-07-28 NOTE — Telephone Encounter (Signed)
Called Marissa Lewis to let her know what Dr Renold Genta said, she verbalized understanding and will call back if it gets worse and she did say she had been constipated since she was on iron.

## 2022-09-21 ENCOUNTER — Other Ambulatory Visit (INDEPENDENT_AMBULATORY_CARE_PROVIDER_SITE_OTHER): Payer: BC Managed Care – PPO

## 2022-09-21 ENCOUNTER — Encounter: Payer: Self-pay | Admitting: Internal Medicine

## 2022-09-21 DIAGNOSIS — D509 Iron deficiency anemia, unspecified: Secondary | ICD-10-CM

## 2022-09-22 LAB — CBC WITH DIFFERENTIAL/PLATELET
Absolute Monocytes: 292 cells/uL (ref 200–950)
Basophils Absolute: 39 cells/uL (ref 0–200)
Basophils Relative: 0.7 %
Eosinophils Absolute: 72 cells/uL (ref 15–500)
Eosinophils Relative: 1.3 %
HCT: 37.6 % (ref 35.0–45.0)
Hemoglobin: 12.6 g/dL (ref 11.7–15.5)
Lymphs Abs: 2035 cells/uL (ref 850–3900)
MCH: 30.1 pg (ref 27.0–33.0)
MCHC: 33.5 g/dL (ref 32.0–36.0)
MCV: 90 fL (ref 80.0–100.0)
MPV: 10.3 fL (ref 7.5–12.5)
Monocytes Relative: 5.3 %
Neutro Abs: 3064 cells/uL (ref 1500–7800)
Neutrophils Relative %: 55.7 %
Platelets: 290 10*3/uL (ref 140–400)
RBC: 4.18 10*6/uL (ref 3.80–5.10)
RDW: 12.8 % (ref 11.0–15.0)
Total Lymphocyte: 37 %
WBC: 5.5 10*3/uL (ref 3.8–10.8)

## 2022-09-22 LAB — IRON, TOTAL/TOTAL IRON BINDING CAP
%SAT: 15 % (calc) — ABNORMAL LOW (ref 16–45)
Iron: 54 ug/dL (ref 40–190)
TIBC: 363 mcg/dL (calc) (ref 250–450)

## 2022-11-09 IMAGING — MG MM PLC BREAST LOC DEV 1ST LESION INC*R*
7 series · 7 of 7 positions shown · non-contrast
Comparison: Previous exam(s).

CLINICAL DATA: Patient for preoperative localization prior to right
breast excision.

EXAM:
MAMMOGRAPHIC GUIDED RADIOACTIVE SEED LOCALIZATION OF THE RIGHT
BREAST

[R ML (1 of 5)]
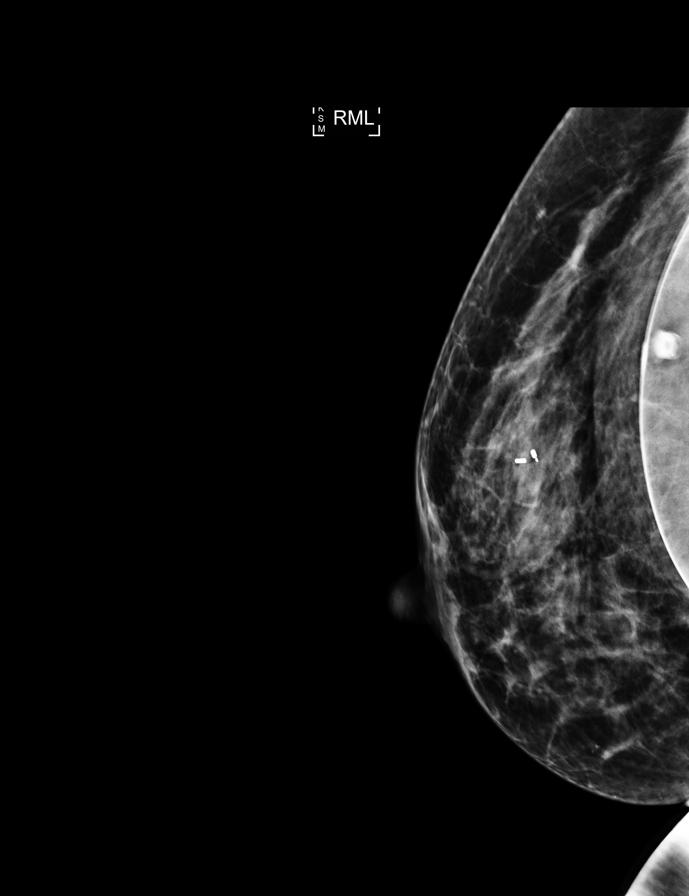

[R CC (1 of 2)]
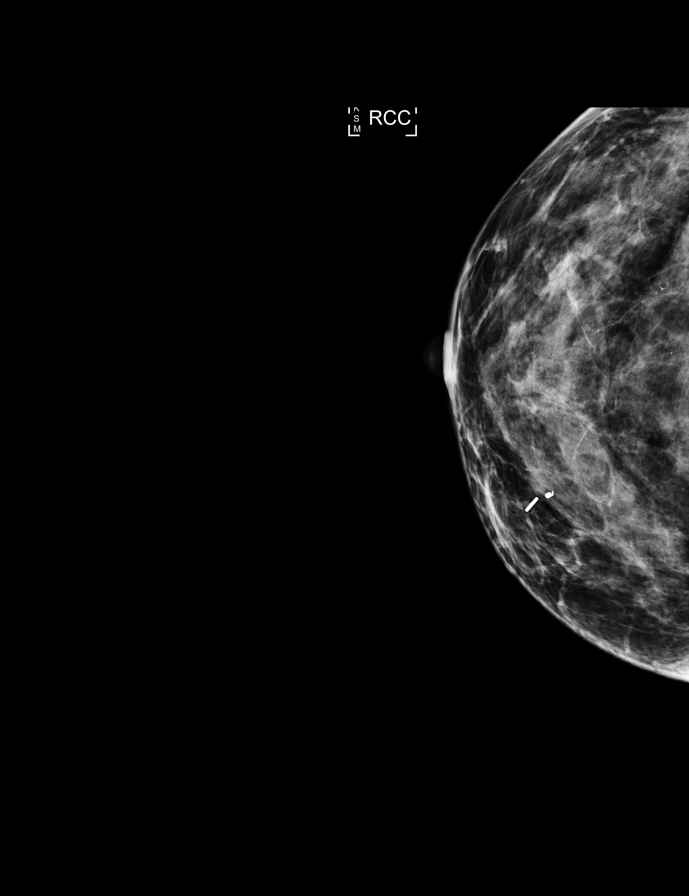

[R CC (2 of 2)]
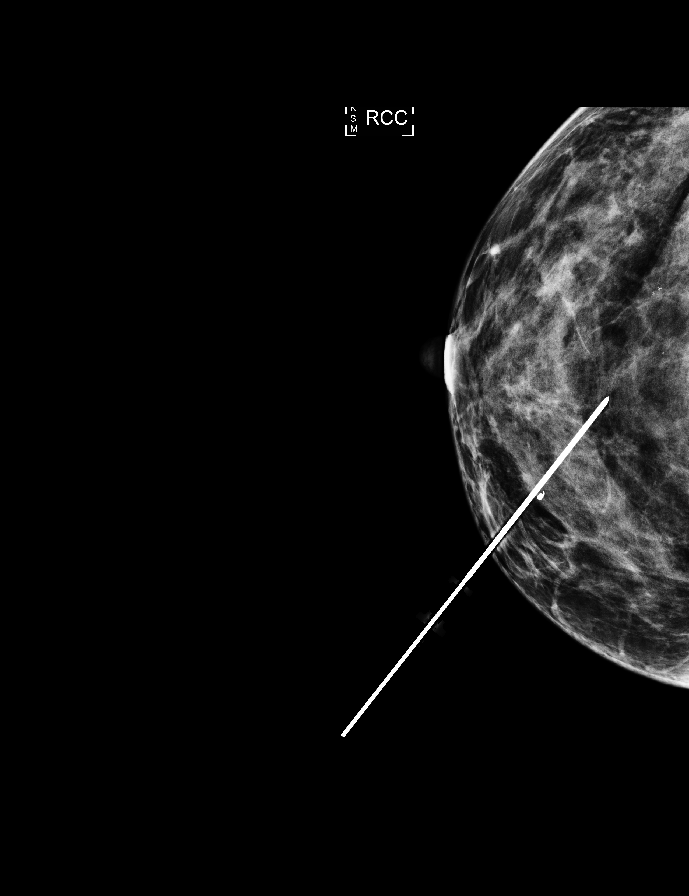

[R ML (2 of 5)]
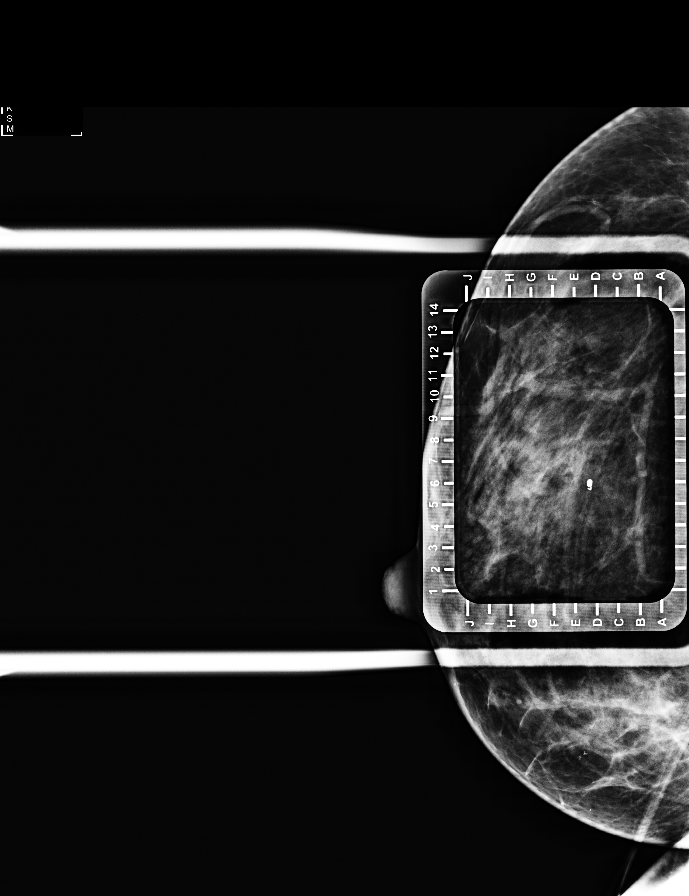

[R ML (3 of 5)]
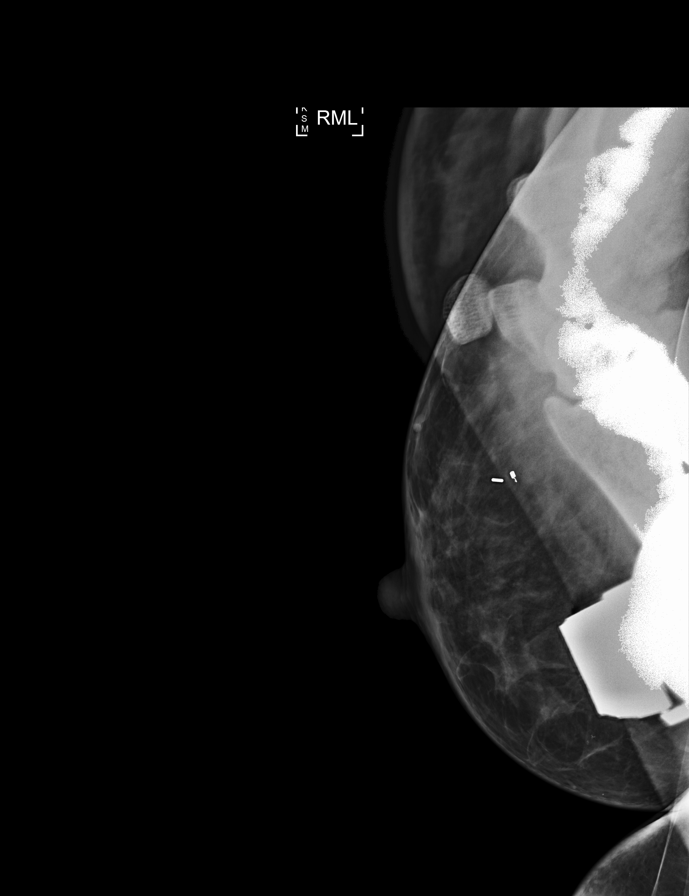

[R ML (4 of 5)]
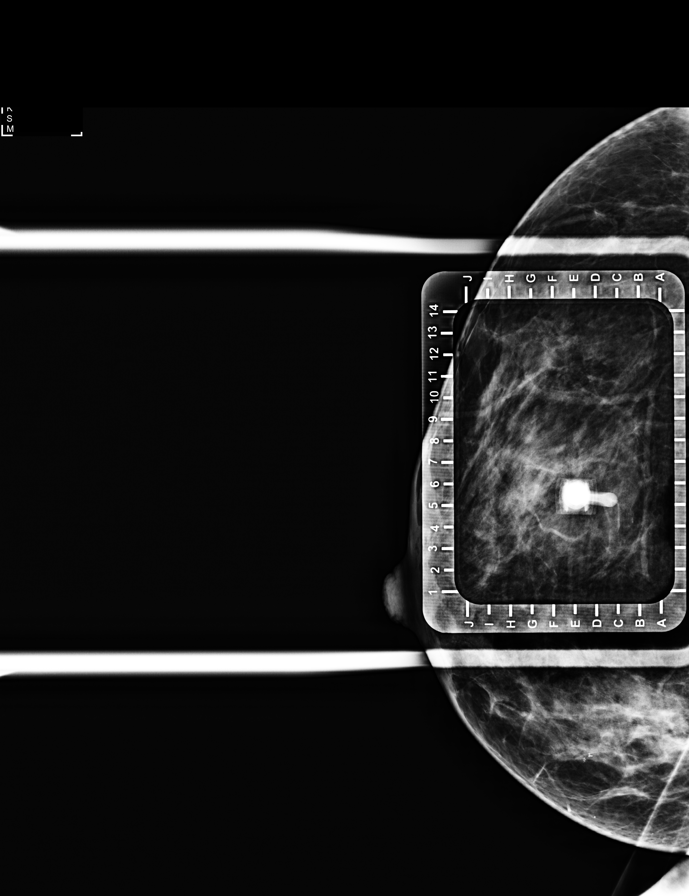

[R ML (5 of 5)]
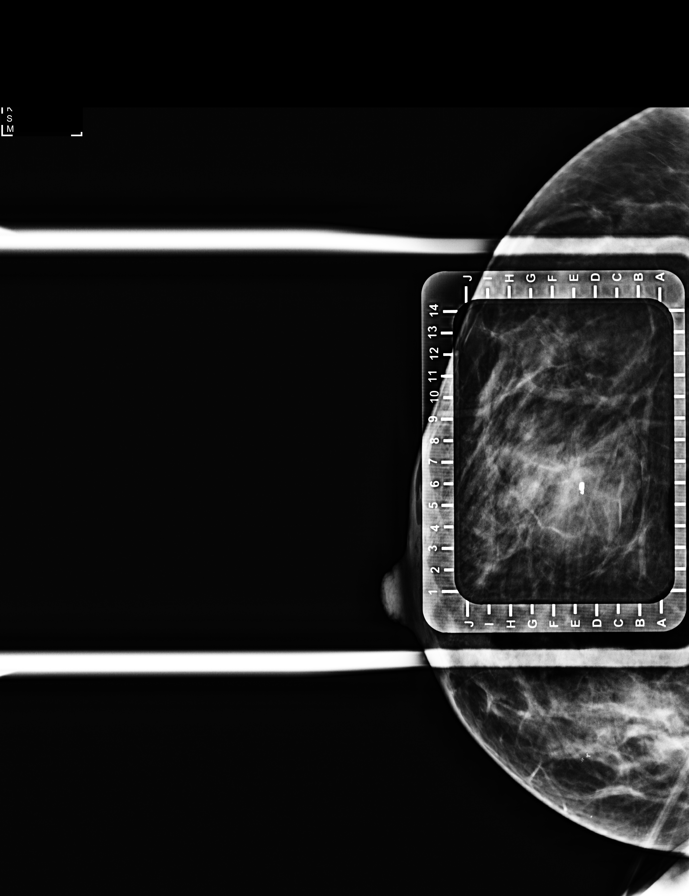

[7 of 7 positions shown; findings below may reference images not displayed]

FINDINGS: Patient presents for radioactive seed localization prior to right
breast excision. I met with the patient and we discussed the
procedure of seed localization including benefits and alternatives.
We discussed the high likelihood of a successful procedure. We
discussed the risks of the procedure including infection, bleeding,
tissue injury, implant rupture and further surgery. We discussed the
low dose of radioactivity involved in the procedure. Informed,
written consent was given.

The usual time-out protocol was performed immediately prior to the
procedure.

Using mammographic guidance, sterile technique, 1% lidocaine and an
1-U75 radioactive seed, coil shaped clip was localized using a
medial approach. The follow-up mammogram images confirm the seed in
the expected location and were marked for Dr. Mallat.

Follow-up survey of the patient confirms presence of the radioactive
seed.

Order number of [id number] seed:  [id number].

Total activity:  0.239 millicuries reference Date: 07/25/2021

The patient tolerated the procedure well and was released from the
[REDACTED]. She was given instructions regarding seed removal.
IMPRESSION: Radioactive seed localization right breast. No apparent
complications.

## 2022-11-10 IMAGING — MG MM BREAST SURGICAL SPECIMEN
2 series · 4 of 4 positions shown · non-contrast
Comparison: Previous exam(s).

CLINICAL DATA: Specimen radiograph status post right breast
excisional biopsy.

EXAM:
SPECIMEN RADIOGRAPH OF THE RIGHT BREAST

[Series 1: R · right · 0.07mm/px · 2 of 2 slices shown (1 of 2)]
[im 1/2]
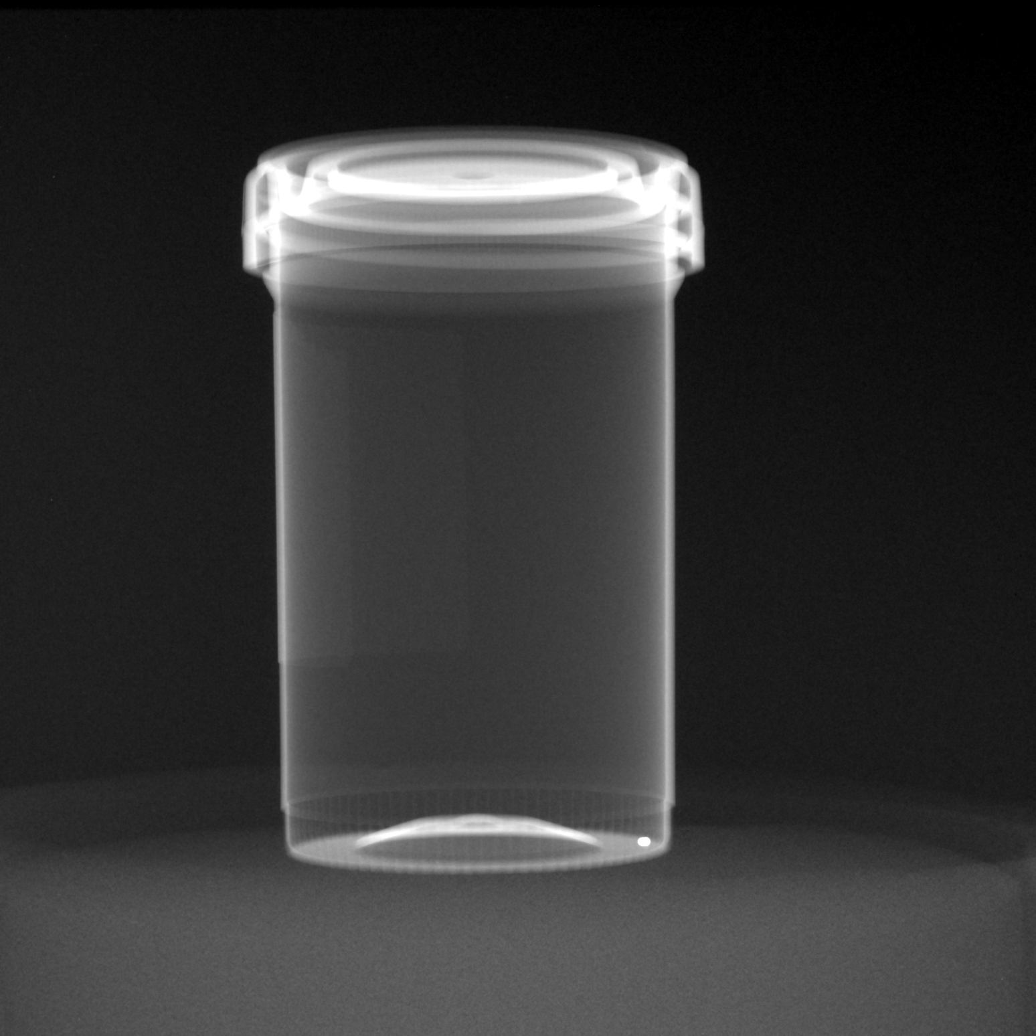
[im 2/2]
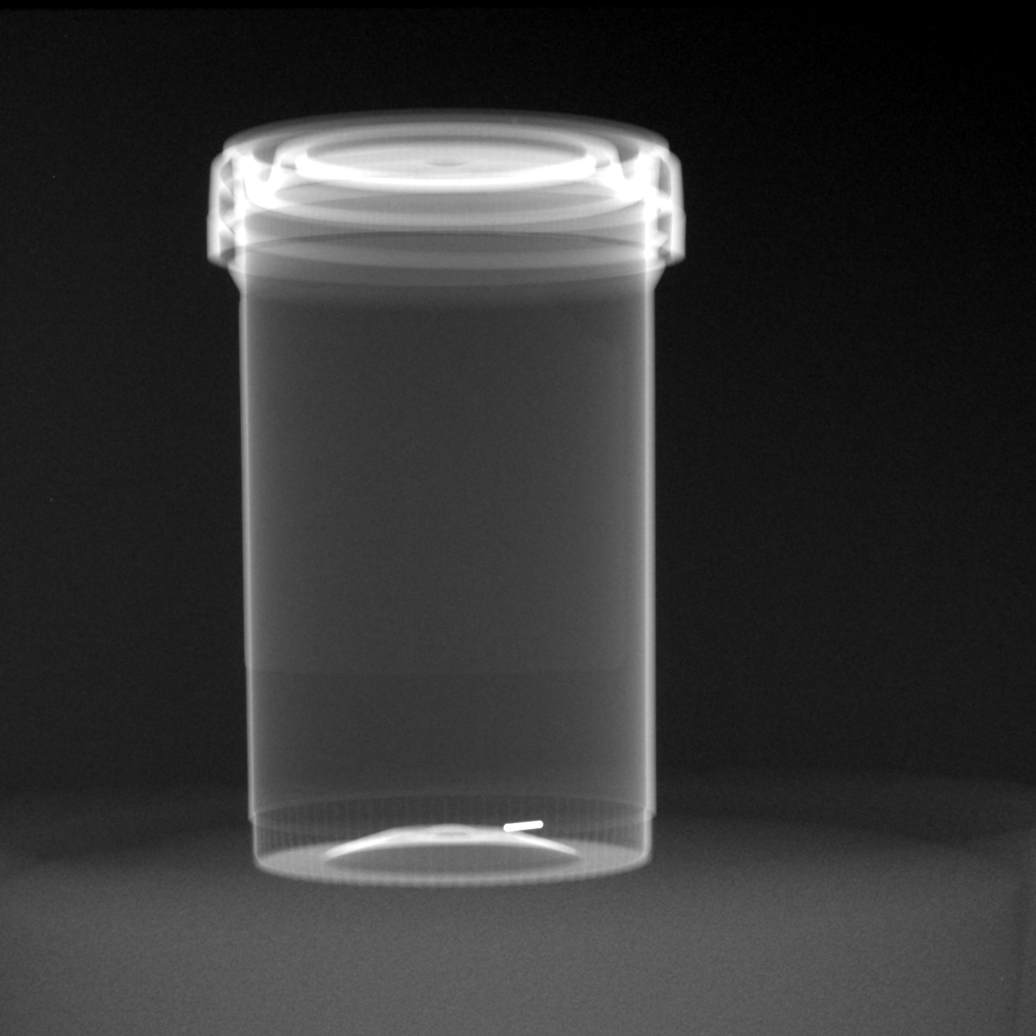

[Series 3: R · right · 0.07mm/px · 2 of 2 slices shown (2 of 2)]
[im 1/2]
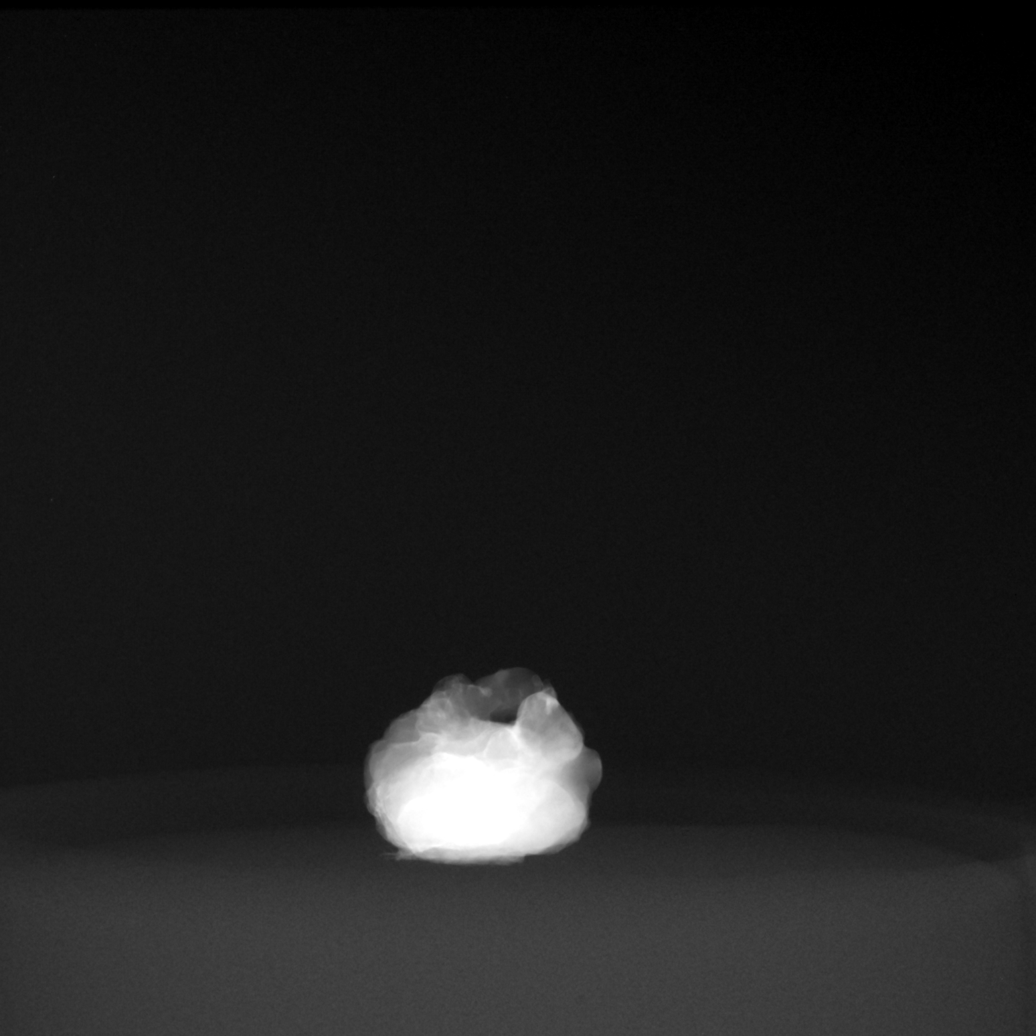
[im 2/2]
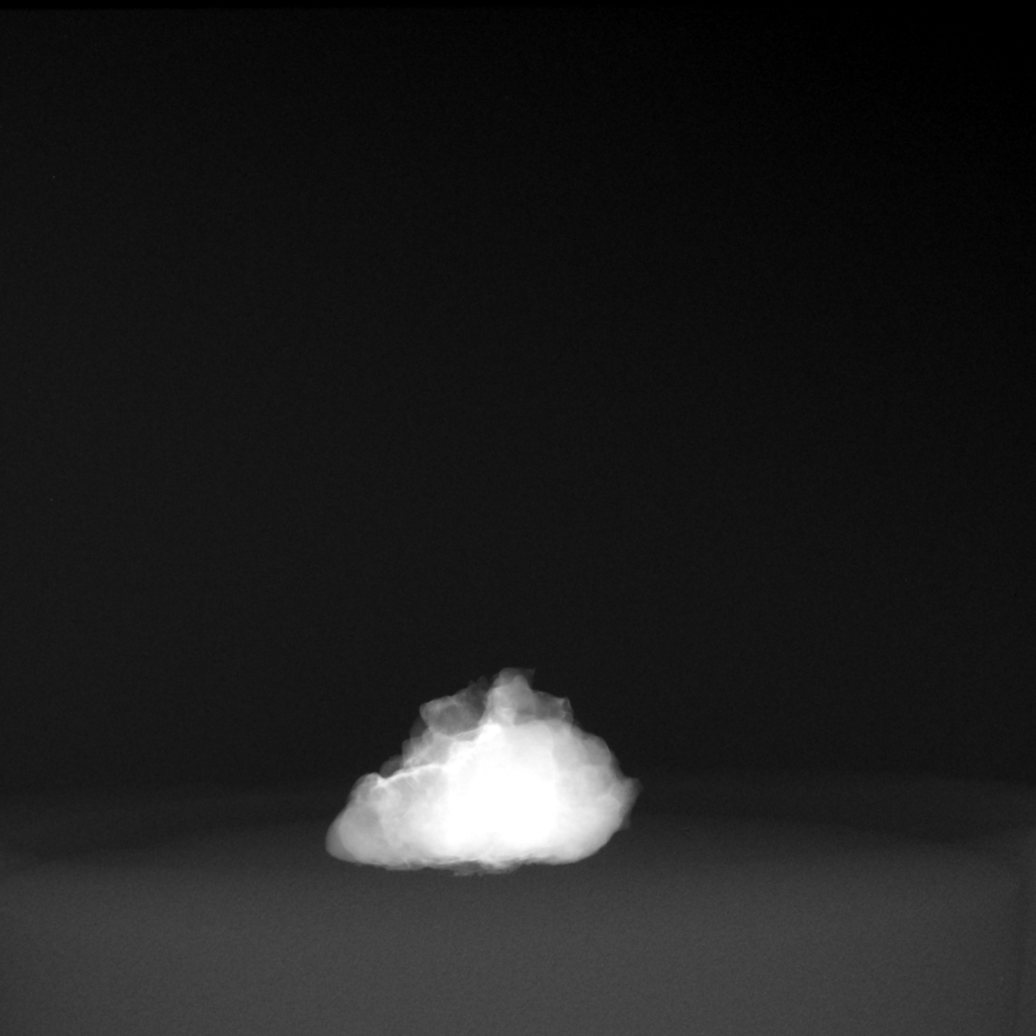

[4 of 4 positions shown; findings below may reference images not displayed]

FINDINGS: Status post excision of the right breast. The biopsy marker clip is
present and intact within the specimen. The radioactive seed is
imaged separately within the specimen container. These findings were
communicated with the OR at [DATE] p.m.
IMPRESSION: Specimen radiograph of the right breast.

## 2022-11-14 ENCOUNTER — Encounter: Payer: Self-pay | Admitting: Cardiology

## 2022-11-14 ENCOUNTER — Ambulatory Visit: Payer: BC Managed Care – PPO | Admitting: Cardiology

## 2022-11-14 VITALS — BP 113/75 | HR 59 | Ht 69.0 in | Wt 173.8 lb

## 2022-11-14 DIAGNOSIS — I341 Nonrheumatic mitral (valve) prolapse: Secondary | ICD-10-CM

## 2022-11-14 DIAGNOSIS — I340 Nonrheumatic mitral (valve) insufficiency: Secondary | ICD-10-CM | POA: Diagnosis not present

## 2022-11-14 NOTE — Progress Notes (Signed)
Marissa Lewis Date of Birth: 1970-09-27 MRN: [id number] Primary Care Provider:Baxley, Luanna Cole, MD Primary Cardiologist: Tessa Lerner, DO (established care 09/18/2019)  Date: 11/14/22 Last Office Visit: May 13, 2021  Chief Complaint  Patient presents with   Moderate mitral regurgitation   Follow-up    1 year    HPI  Marissa Lewis is a 52 y.o.  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  She presents today for 37-month follow-up visit.  Since last office visit she denies anginal chest pain or heart failure symptoms.   FUNCTIONAL STATUS: Goes to the gym three times a week (mostly weights and cardio for 15 minutes).   ALLERGIES: Allergies  Allergen Reactions   Other Other (See Comments)    coconut   Penicillins Rash   Sulfa Antibiotics Hives and Rash    MEDICATION LIST PRIOR TO VISIT: Current Outpatient Medications on File Prior to Visit  Medication Sig Dispense Refill   Ascorbic Acid (VITAMIN C ER PO) Take 1 tablet by mouth daily.     ergocalciferol (VITAMIN D2) 1.25 MG (50000 UT) capsule Take 1 capsule (50,000 Units total) by mouth once a week. 12 capsule 3   ferrous sulfate 325 (65 FE) MG tablet Take 325 mg by mouth daily with breakfast.     ibuprofen (ADVIL) 200 MG tablet Take 400 mg by mouth every 6 (six) hours as needed for moderate pain.     Multiple Vitamin (MULTIVITAMIN) capsule Take 1 capsule by mouth daily.     Zinc 50 MG CAPS Take 1 capsule by mouth daily.     No current facility-administered medications on file prior to visit.    PAST MEDICAL HISTORY: Past Medical History:  Diagnosis Date   Anxiety    Depression    Gilbert's syndrome    Mitral regurgitation    UTI (lower urinary tract infection)     PAST SURGICAL HISTORY: Past Surgical History:   Procedure Laterality Date   APPENDECTOMY     BREAST LUMPECTOMY WITH RADIOACTIVE SEED LOCALIZATION Right 08/11/2021   Procedure: RIGHT BREAST LUMPECTOMY WITH RADIOACTIVE SEED LOCALIZATION;  Surgeon: Griselda Miner, MD;  Location: [facility];  Service: General;  Laterality: Right;   BUBBLE STUDY  09/23/2019   Procedure: BUBBLE STUDY;  Surgeon: Tessa Lerner, DO;  Location: [facility];  Service: Cardiovascular;;   CESAREAN SECTION     TEE WITHOUT CARDIOVERSION N/A 09/23/2019   Procedure: TRANSESOPHAGEAL ECHOCARDIOGRAM (TEE);  Surgeon: Tessa Lerner, DO;  Location: [facility];  Service: Cardiovascular;  Laterality: N/A;   TONSILLECTOMY     UTERINE FIBROID SURGERY      FAMILY HISTORY: The patient's family history includes Cancer in her maternal grandmother; Diabetes in her father, mother, and sister; Hyperlipidemia in her brother and mother; Lung cancer in her father.   SOCIAL HISTORY:  The patient  reports that she has never smoked. She has never used smokeless tobacco. She reports current alcohol use. She reports that she does not use drugs.  Review of Systems  Constitutional: Negative for chills and fever.  HENT:  Negative for ear discharge, ear pain and nosebleeds.   Eyes:  Negative for blurred vision and discharge.  Cardiovascular:  Negative for chest pain, claudication, dyspnea on exertion, leg swelling, near-syncope, orthopnea, palpitations, paroxysmal nocturnal dyspnea and syncope.  Respiratory:  Negative for cough  and shortness of breath.   Endocrine: Negative for polydipsia, polyphagia and polyuria.  Hematologic/Lymphatic: Negative for bleeding problem.  Skin:  Negative for flushing and nail changes.  Musculoskeletal:  Negative for muscle cramps, muscle weakness and myalgias.  Gastrointestinal:  Negative for abdominal pain, dysphagia, hematemesis, hematochezia, melena, nausea and vomiting.  Neurological:  Negative for dizziness, focal weakness and light-headedness.     PHYSICAL EXAM:    11/14/2022    1:20 PM 06/19/2022   11:33 AM 10/16/2021    2:12 PM  Vitals with BMI  Height 5\' 9"  5\' 9"    Weight 173 lbs 13 oz 170 lbs 13 oz   BMI 25.65 25.21   Systolic 113 118 161  Diastolic 75 70 75  Pulse 59 66 54   Physical Exam  Constitutional: No distress.  Age appropriate, hemodynamically stable.   Neck: No JVD present.  Cardiovascular: Normal rate, regular rhythm, S1 normal, S2 normal, intact distal pulses and normal pulses. Exam reveals no gallop, no S3 and no S4.  Murmur heard. Holosystolic murmur is present with a grade of 3/6 at the apex. Pulmonary/Chest: Effort normal and breath sounds normal. No stridor. She has no wheezes. She has no rales.  Abdominal: Soft. Bowel sounds are normal. She exhibits no distension. There is no abdominal tenderness.  Musculoskeletal:        General: No edema.     Cervical back: Neck supple.  Neurological: She is alert and oriented to person, place, and time. She has intact cranial nerves (2-12).  Skin: Skin is warm and moist.   CARDIAC DATABASE: EKG: November 14, 2022: Sinus bradycardia, 57 bpm, TWI in inferior leads, no significant change compared to prior tracing.  Echocardiogram: 09/05/2018: LVEF 60%. Severe myxomatous MV, moderate eccentric, posteriorly directed  mitral regurgitation.   09/11/2019: LVEF 60%, Normal diastology, Myxomatous degeneration with bileaflet prolapse. Moderate (Grade III) posteriorly directed, mild TR.  05/12/2022:  Left ventricle cavity is normal in size. Mild concentric hypertrophy of  the left ventricle. Normal global wall motion. Normal LV systolic function  with EF 67%. Normal diastolic filling pattern.  Left atrial cavity is mildly dilated.  Myxomatous mitral valve with bileaflet prolapse. Mild (Grade I) mitral  regurgitation.  Mild tricuspid regurgitation.  No evidence of pulmonary hypertension.  No significant change compared to previous study on 05/06/2021.   Transesophageal  echocardiogram 09/23/2019: LVEF 60-65%, mild LVH, normal diastolic parameters, myxomatous mitral valve with bileaflet prolapse.  Multiple jets noted with moderate late peaking regurgitation.  No mitral stenosis.  Agitated saline study negative for inter atrial shunting.  05/06/2021: Normal LV systolic function with visual EF 60-65%. Left ventricle cavity is normal in size. Normal left ventricular wall thickness. Normal global wall motion. Normal diastolic filling pattern, normal LAP. Left atrial cavity is mildly dilated. Native mitral valve, myxomatous degeneration, bilateral mild prolapse of the mitral valve leaflets. No evidence of mitral stenosis. Mild (Grade I) mitral regurgitation. Compared to study 09/11/2019 LVEF is preserved, Moderate MR is now mild, Mild TR is now resolved, otherwise no significant change.  Stress Testing:  Exercise treadmill stress test 05/06/2021: Functional status: Poor.  Chest pain: None.  Reason for stopping exercise: Fatigue/weakness.  Hypertensive response to exercise: No.  Exercise time 5 minutes 24 seconds on Bruce protocol, achieved 7.05 METS, 87% of age-predicted maximum heart rate.  Stress ECG positive for ischemia.  Poor functional capacity for age, blunted blood pressure response to exercise, stress ST-T changes resolve within 1 minute into recovery, Duke treadmill score 0.4.  Intermediate risk study. Clinical correlation required  Heart Catheterization: None  LABORATORY DATA:    Latest Ref Rng & Units 09/21/2022   10:13 AM 06/19/2022   11:46 AM 04/04/2021   10:54 AM  CBC  WBC 3.8 - 10.8 Thousand/uL 5.5  5.6  5.6   Hemoglobin 11.7 - 15.5 g/dL 27.2  53.6  64.4   Hematocrit 35.0 - 45.0 % 37.6  36.2  36.9   Platelets 140 - 400 Thousand/uL 290  298  270        Latest Ref Rng & Units 06/19/2022   11:46 AM 04/04/2021   10:54 AM 03/25/2021    9:23 AM  CMP  Glucose 65 - 99 mg/dL 90  034  94   BUN 7 - 25 mg/dL 11  9  12    Creatinine 0.50 - 0.99 mg/dL  7.42  5.95  6.38   Sodium 135 - 146 mmol/L 142  137  139   Potassium 3.5 - 5.3 mmol/L 4.3  3.8  4.3   Chloride 98 - 110 mmol/L 108  107  108   CO2 20 - 32 mmol/L 27  24  24    Calcium 8.6 - 10.2 mg/dL 9.4  9.0  9.1   Total Protein 6.1 - 8.1 g/dL 6.9  6.6  6.6   Total Bilirubin 0.2 - 1.2 mg/dL 1.7  1.6  1.8   Alkaline Phos 38 - 126 U/L  45    AST 10 - 35 U/L 13  18  12    ALT 6 - 29 U/L 9  16  9      Lipid Panel     Component Value Date/Time   CHOL 190 06/19/2022 1146   TRIG 75 06/19/2022 1146   HDL 68 06/19/2022 1146   CHOLHDL 2.8 06/19/2022 1146   VLDL 9 09/09/2012 0936   LDLCALC 106 (H) 06/19/2022 1146    No results found for: "HGBA1C" No components found for: "NTPROBNP" Lab Results  Component Value Date   TSH 0.46 06/19/2022   TSH 0.67 03/25/2021   TSH 1.44 03/22/2020    Cardiac Panel (last 3 results) No results for input(s): "CKTOTAL", "CKMB", "TROPONINIHS", "RELINDX" in the last 72 hours.  FINAL MEDICATION LIST END OF ENCOUNTER: No orders of the defined types were placed in this encounter.   Medications Discontinued During This Encounter  Medication Reason   Biotin 1 MG CAPS    Vitamin D, Ergocalciferol, (DRISDOL) 1.25 MG (50000 UNIT) CAPS capsule      Current Outpatient Medications:    Ascorbic Acid (VITAMIN C ER PO), Take 1 tablet by mouth daily., Disp: , Rfl:    ergocalciferol (VITAMIN D2) 1.25 MG (50000 UT) capsule, Take 1 capsule (50,000 Units total) by mouth once a week., Disp: 12 capsule, Rfl: 3   ferrous sulfate 325 (65 FE) MG tablet, Take 325 mg by mouth daily with breakfast., Disp: , Rfl:    ibuprofen (ADVIL) 200 MG tablet, Take 400 mg by mouth every 6 (six) hours as needed for moderate pain., Disp: , Rfl:    Multiple Vitamin (MULTIVITAMIN) capsule, Take 1 capsule by mouth daily., Disp: , Rfl:    Zinc 50 MG CAPS, Take 1 capsule by mouth daily., Disp: , Rfl:   IMPRESSION:    ICD-10-CM   1. Moderate mitral regurgitation  I34.0 EKG 12-Lead    PCV  ECHOCARDIOGRAM COMPLETE    2. Mitral valve prolapse  I34.1 PCV ECHOCARDIOGRAM COMPLETE       RECOMMENDATIONS: Marissa Lewis  is a 52 y.o. 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  Mitral valve prolapse and moderate mitral regurgitation: Known history of myxomatous mitral valve with mitral prolapse and based on most recent echo severity is mild. Blood pressures are well-controlled. Denies anginal chest pain or heart failure symptoms. Recommend follow-up echo in 2025 prior to next annual office visit.  EKG and echocardiogram independently reviewed the part of medical decision making at today's office visit.   Orders Placed This Encounter  Procedures   EKG 12-Lead   PCV ECHOCARDIOGRAM COMPLETE   --Continue cardiac medications as reconciled in final medication list. --Return in about 1 year (around 11/14/2023) for Follow up mitral regurgitation, status post echo. Or sooner if needed. --Continue follow-up with your primary care physician regarding the management of your other chronic comorbid conditions.  Patient's questions and concerns were addressed to her satisfaction. She voices understanding of the instructions provided during this encounter.    This note was created using a voice recognition software as a result there may be grammatical errors inadvertently enclosed that do not reflect the nature of this encounter. Every attempt is made to correct such errors.  Tessa Lerner, [place], [facility]  Pager:  [phone] Office: [phone]

## 2022-11-17 ENCOUNTER — Encounter: Payer: Self-pay | Admitting: Cardiology

## 2023-03-02 ENCOUNTER — Other Ambulatory Visit: Payer: Self-pay | Admitting: Internal Medicine

## 2023-03-02 DIAGNOSIS — Z1211 Encounter for screening for malignant neoplasm of colon: Secondary | ICD-10-CM

## 2023-03-02 DIAGNOSIS — Z1212 Encounter for screening for malignant neoplasm of rectum: Secondary | ICD-10-CM

## 2023-03-13 NOTE — Progress Notes (Shared)
Patient Care Team: Margaree Mackintosh, MD as PCP - General (Internal Medicine)  Visit Date: 03/13/23  Subjective:    Patient ID: Marissa Lewis , Female   DOB: 1970/10/01, 52 y.o.    MRN: [id number]   52 y.o. Female presents today for annual comprehensive physical exam. History of anxiety and depression, Gilbert's syndrome, mitral regurgitation, UTI.  History of iron deficiency treated with ferrous sulfate 325 mg daily.  History of Vitamin D deficiency treated with Vitamin D2 50,000 units weekly.  Her general health is excellent.  She tries to get regular exercise.  She is followed by cardiologist Dr. Odis Hollingshead.  History of mitral regurgitation.  She had transesophageal echocardiogram in 2021.  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  Past medical history: Cesarean section, uterine fibroid surgery, appendectomy, tonsillectomy and adenoidectomy in 1980.  Bilateral breast implants by Dr. Benna Dunks about 10 years ago.  Right breast adenoma removed at [facility] in April 1999.  Urticaria August 2011.  Non-smoker.  Social alcohol consumption.  History of bacterial vaginosis.  She had COVID-19 in December 2021 but recovered  History of Gilbert's syndrome diagnosed by Dr. Leone Payor in 2008.  In 2005 she had cholestatic jaundice thought to be due to oral contraceptives.  She is allergic to Penicillin-it causes rash and hives.  SBE prophylaxis has been recommended by cardiologist.   05/18/21 mammogram showed possible distortion in right breast warranting further evaluation, no suspicious masses or malignant type calcifications in left breast. 06/29/21 follow-up right mammogram with ultrasound showed possible right breast distortion, fibrocystic changes with biopsy recommended. 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  Social history: She has 2  children and works for the [employer].  Married.  Never smoked.  Family history colon cancer maternal grandmother. Diabetes in father, mother and sister. Hyperlipidemia in brother and mother. Lung cancer in father.      Vaccine counseling: due for tetanus, shingles vaccines.  Past Medical History:  Diagnosis Date   Anxiety    Depression    Gilbert's syndrome    Mitral regurgitation    UTI (lower urinary tract infection)      Family History  Problem Relation Age of Onset   Diabetes Mother    Hyperlipidemia Mother    Diabetes Father    Lung cancer Father    Cancer Maternal Grandmother        breast   Diabetes Sister    Hyperlipidemia Brother     Social History   Social History Narrative   Not on file      ROS      Objective:   Vitals: There were no vitals taken for this visit.   Physical Exam    Results:   Studies obtained and personally reviewed by me:  Imaging, colonoscopy, mammogram, bone density scan, echocardiogram, heart cath, stress test, CT calcium score, etc. ***   Labs:       Component Value Date/Time   NA 142 06/19/2022 1146   K 4.3 06/19/2022 1146   CL 108 06/19/2022 1146   CO2 27 06/19/2022 1146   GLUCOSE 90 06/19/2022 1146   BUN 11 06/19/2022 1146   CREATININE 0.87 06/19/2022 1146   CALCIUM 9.4 06/19/2022 1146   PROT 6.9 06/19/2022 1146   ALBUMIN 3.9 04/04/2021 1054   AST 13 06/19/2022 1146   ALT 9 06/19/2022 1146  ALKPHOS 45 04/04/2021 1054   BILITOT 1.7 (H) 06/19/2022 1146   GFRNONAA >60 04/04/2021 1054   GFRNONAA 59 (L) 03/22/2020 0945   GFRAA 68 03/22/2020 0945     Lab Results  Component Value Date   WBC 5.5 09/21/2022   HGB 12.6 09/21/2022   HCT 37.6 09/21/2022   MCV 90.0 09/21/2022   PLT 290 09/21/2022    Lab Results  Component Value Date   CHOL 190 06/19/2022   HDL 68 06/19/2022   LDLCALC 106 (H) 06/19/2022   TRIG 75 06/19/2022   CHOLHDL 2.8 06/19/2022    No results found for: "HGBA1C"   Lab  Results  Component Value Date   TSH 0.46 06/19/2022     No results found for: "PSA1", "PSA" *** delete for female pts  ***    Assessment & Plan:   ***    I,Alexander Ruley,acting as a scribe for Margaree Mackintosh, MD.,have documented all relevant documentation on the behalf of Margaree Mackintosh, MD,as directed by  Margaree Mackintosh, MD while in the presence of Margaree Mackintosh, MD.   ***

## 2023-03-16 ENCOUNTER — Other Ambulatory Visit: Payer: BC Managed Care – PPO

## 2023-03-19 ENCOUNTER — Other Ambulatory Visit: Payer: BC Managed Care – PPO

## 2023-03-19 DIAGNOSIS — E7800 Pure hypercholesterolemia, unspecified: Secondary | ICD-10-CM

## 2023-03-19 DIAGNOSIS — Z0000 Encounter for general adult medical examination without abnormal findings: Secondary | ICD-10-CM

## 2023-03-19 DIAGNOSIS — Z1329 Encounter for screening for other suspected endocrine disorder: Secondary | ICD-10-CM | POA: Diagnosis not present

## 2023-03-19 DIAGNOSIS — Z13220 Encounter for screening for lipoid disorders: Secondary | ICD-10-CM | POA: Diagnosis not present

## 2023-03-20 ENCOUNTER — Ambulatory Visit (INDEPENDENT_AMBULATORY_CARE_PROVIDER_SITE_OTHER): Payer: BC Managed Care – PPO | Admitting: Internal Medicine

## 2023-03-20 ENCOUNTER — Encounter: Payer: BC Managed Care – PPO | Admitting: Internal Medicine

## 2023-03-20 ENCOUNTER — Encounter: Payer: Self-pay | Admitting: Internal Medicine

## 2023-03-20 VITALS — BP 102/80 | HR 67 | Ht 69.0 in | Wt 174.0 lb

## 2023-03-20 DIAGNOSIS — I340 Nonrheumatic mitral (valve) insufficiency: Secondary | ICD-10-CM | POA: Diagnosis not present

## 2023-03-20 DIAGNOSIS — Z9882 Breast implant status: Secondary | ICD-10-CM

## 2023-03-20 DIAGNOSIS — E559 Vitamin D deficiency, unspecified: Secondary | ICD-10-CM | POA: Diagnosis not present

## 2023-03-20 DIAGNOSIS — Z23 Encounter for immunization: Secondary | ICD-10-CM | POA: Diagnosis not present

## 2023-03-20 DIAGNOSIS — Z1239 Encounter for other screening for malignant neoplasm of breast: Secondary | ICD-10-CM

## 2023-03-20 DIAGNOSIS — Z0000 Encounter for general adult medical examination without abnormal findings: Secondary | ICD-10-CM | POA: Diagnosis not present

## 2023-03-20 DIAGNOSIS — R319 Hematuria, unspecified: Secondary | ICD-10-CM | POA: Diagnosis not present

## 2023-03-20 LAB — COMPLETE METABOLIC PANEL WITH GFR
AG Ratio: 1.8 (calc) (ref 1.0–2.5)
ALT: 12 U/L (ref 6–29)
AST: 14 U/L (ref 10–35)
Albumin: 4.4 g/dL (ref 3.6–5.1)
Alkaline phosphatase (APISO): 50 U/L (ref 37–153)
BUN: 10 mg/dL (ref 7–25)
CO2: 27 mmol/L (ref 20–32)
Calcium: 9.1 mg/dL (ref 8.6–10.4)
Chloride: 106 mmol/L (ref 98–110)
Creat: 0.9 mg/dL (ref 0.50–1.03)
Globulin: 2.4 g/dL (ref 1.9–3.7)
Glucose, Bld: 92 mg/dL (ref 65–99)
Potassium: 3.8 mmol/L (ref 3.5–5.3)
Sodium: 140 mmol/L (ref 135–146)
Total Bilirubin: 1.8 mg/dL — ABNORMAL HIGH (ref 0.2–1.2)
Total Protein: 6.8 g/dL (ref 6.1–8.1)
eGFR: 78 mL/min/{1.73_m2} (ref >=60)

## 2023-03-20 LAB — CBC WITH DIFFERENTIAL/PLATELET
Absolute Lymphocytes: 2448 {cells}/uL (ref 850–3900)
Absolute Monocytes: 396 {cells}/uL (ref 200–950)
Basophils Absolute: 42 {cells}/uL (ref 0–200)
Basophils Relative: 0.7 %
Eosinophils Absolute: 90 {cells}/uL (ref 15–500)
Eosinophils Relative: 1.5 %
HCT: 38 % (ref 35.0–45.0)
Hemoglobin: 12.6 g/dL (ref 11.7–15.5)
MCH: 30.2 pg (ref 27.0–33.0)
MCHC: 33.2 g/dL (ref 32.0–36.0)
MCV: 91.1 fL (ref 80.0–100.0)
MPV: 10.4 fL (ref 7.5–12.5)
Monocytes Relative: 6.6 %
Neutro Abs: 3024 {cells}/uL (ref 1500–7800)
Neutrophils Relative %: 50.4 %
Platelets: 306 10*3/uL (ref 140–400)
RBC: 4.17 10*6/uL (ref 3.80–5.10)
RDW: 12.7 % (ref 11.0–15.0)
Total Lymphocyte: 40.8 %
WBC: 6 10*3/uL (ref 3.8–10.8)

## 2023-03-20 LAB — POCT URINALYSIS DIP (CLINITEK)
Bilirubin, UA: NEGATIVE
Glucose, UA: NEGATIVE mg/dL
Ketones, POC UA: NEGATIVE mg/dL
Leukocytes, UA: NEGATIVE
Nitrite, UA: NEGATIVE
POC PROTEIN,UA: NEGATIVE
Spec Grav, UA: 1.015 (ref 1.010–1.025)
Urobilinogen, UA: 0.2 U/dL
pH, UA: 6 (ref 5.0–8.0)

## 2023-03-20 LAB — LIPID PANEL
Cholesterol: 201 mg/dL — ABNORMAL HIGH (ref <200)
HDL: 74 mg/dL (ref >=50)
LDL Cholesterol (Calc): 113 mg/dL — ABNORMAL HIGH
Non-HDL Cholesterol (Calc): 127 mg/dL (ref <130)
Total CHOL/HDL Ratio: 2.7 (calc) (ref <5.0)
Triglycerides: 62 mg/dL (ref <150)

## 2023-03-20 LAB — TSH: TSH: 0.88 m[IU]/L

## 2023-03-20 NOTE — Progress Notes (Signed)
Patient Care Team: Margaree Mackintosh, MD as PCP - General (Internal Medicine)  Visit Date: 03/20/23  Subjective:    Patient ID: Marissa Lewis , Female   DOB: 06-16-1970, 52 y.o.    MRN: [id number]   52 y.o. Female presents today for annual comprehensive physical exam. History of anxiety and depression, Gilbert's syndrome, mitral regurgitation, UTI.  History of elevated cholesterol. CHOL elevated at 201. LDL elevated at 113 on 03/19/23, up from 106 nine months ago.  Followed by [facility] for moderate mitral regurgitation. June 2024 EKG showed sinus bradycardia. She had transesophageal echocardiogram in 2021.  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  Discussed situational stress regarding family issues.  History of endometriosis. She is having menses every three weeks and reports abdominal cramps before start of menstrual cycles.   History of iron deficiency treated with ferrous sulfate 325 mg daily.   History of Vitamin D deficiency treated with Vitamin D2 50,000 units weekly.  Past medical history: Cesarean section, uterine fibroid surgery, appendectomy, tonsillectomy and adenoidectomy in 1980.  Bilateral breast implants by Dr. Benna Dunks about 10 years ago.  Right breast adenoma removed at [facility] in April 1999.  Urticaria August 2011.  Non-smoker.  Social alcohol consumption.  History of bacterial vaginosis.  She had COVID-19 in December 2021 but recovered.  History of Gilbert's syndrome diagnosed by Dr. Leone Payor in 2008. Total bilirubin elevated at 1.8.  In 2005 she had cholestatic jaundice thought to be due to oral contraceptives.   She is allergic to Penicillin-it causes rash and hives.  SBE prophylaxis has been recommended by cardiologist.   Labs reviewed today. Glucose normal. Kidney, liver functions normal. Electrolytes normal. Blood proteins normal. CBC normal. TSH at 0.88.   05/18/21  mammogram showed possible distortion in right breast warranting further evaluation, no suspicious masses or malignant type calcifications in left breast. 06/29/21 follow-up right mammogram with ultrasound showed possible right breast distortion, fibrocystic changes with biopsy recommended. 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   Social history: She has 2 children and works for the [employer].  Married.  Never smoked.   Family history colon cancer maternal grandmother. Diabetes in father, mother and sister. Hyperlipidemia in brother and mother. Lung cancer in father.   Past Medical History:  Diagnosis Date   Anxiety    Depression    Gilbert's syndrome    Mitral regurgitation    UTI (lower urinary tract infection)      Family History  Problem Relation Age of Onset   Diabetes Mother    Hyperlipidemia Mother    Diabetes Father    Lung cancer Father    Cancer Maternal Grandmother        breast   Diabetes Sister    Hyperlipidemia Brother     Social History   Social History Narrative   Not on file      Review of Systems  Constitutional:  Negative for chills, fever, malaise/fatigue and weight loss.  HENT:  Negative for hearing loss, sinus pain and sore throat.   Respiratory:  Negative for cough, hemoptysis and shortness of breath.   Cardiovascular:  Negative for chest pain, palpitations, leg swelling and PND.  Gastrointestinal:  Positive for abdominal pain (Lower). Negative for constipation, diarrhea, heartburn, nausea and vomiting.  Genitourinary:  Negative for dysuria, frequency and urgency.  Musculoskeletal:  Negative for  back pain, myalgias and neck pain.  Skin:  Negative for itching and rash.  Neurological:  Negative for dizziness, tingling, seizures and headaches.  Endo/Heme/Allergies:  Negative for polydipsia.  Psychiatric/Behavioral:  Negative for depression. The  patient is not nervous/anxious.         Objective:   Vitals: BP 102/80   Pulse 67   Ht 5\' 9"  (1.753 m)   Wt 174 lb (78.9 kg)   LMP 03/02/2023   SpO2 97%   BMI 25.70 kg/m    Physical Exam Vitals and nursing note reviewed.  Constitutional:      General: She is not in acute distress.    Appearance: Normal appearance. She is not ill-appearing or toxic-appearing.  HENT:     Head: Normocephalic and atraumatic.     Right Ear: Hearing, ear canal and external ear normal.     Left Ear: Hearing, tympanic membrane, ear canal and external ear normal.     Ears:     Comments: Right TM obscured by cerumen.    Mouth/Throat:     Pharynx: Oropharynx is clear.  Eyes:     Extraocular Movements: Extraocular movements intact.     Pupils: Pupils are equal, round, and reactive to light.  Neck:     Thyroid: No thyroid mass, thyromegaly or thyroid tenderness.     Vascular: No carotid bruit.  Cardiovascular:     Rate and Rhythm: Normal rate and regular rhythm. No extrasystoles are present.    Pulses:          Dorsalis pedis pulses are 1+ on the right side and 1+ on the left side.     Heart sounds: Normal heart sounds. No murmur heard.    No friction rub. No gallop.  Pulmonary:     Effort: Pulmonary effort is normal.     Breath sounds: Normal breath sounds. No decreased breath sounds, wheezing, rhonchi or rales.  Chest:     Chest wall: No mass.  Abdominal:     Palpations: Abdomen is soft. There is no hepatomegaly, splenomegaly or mass.     Tenderness: There is abdominal tenderness.     Hernia: No hernia is present.     Comments: Tenderness lower abdomen just below umbilicus without rebound.  Musculoskeletal:     Cervical back: Normal range of motion.     Right lower leg: No edema.     Left lower leg: No edema.  Lymphadenopathy:     Cervical: No cervical adenopathy.     Upper Body:     Right upper body: No supraclavicular adenopathy.     Left upper body: No supraclavicular adenopathy.   Skin:    General: Skin is warm and dry.  Neurological:     General: No focal deficit present.     Mental Status: She is alert and oriented to person, place, and time. Mental status is at baseline.     Sensory: Sensation is intact.     Motor: Motor function is intact. No weakness.     Deep Tendon Reflexes: Reflexes are normal and symmetric.  Psychiatric:        Attention and Perception: Attention normal.        Mood and Affect: Mood normal.        Speech: Speech normal.        Behavior: Behavior normal.        Thought Content: Thought content normal.        Cognition and Memory: Cognition normal.  Judgment: Judgment normal.       Results:   Studies obtained and personally reviewed by me:  June 2024 EKG showed sinus bradycardia.  05/18/21 mammogram showed possible distortion in right breast warranting further evaluation, no suspicious masses or malignant type calcifications in left breast.   06/29/21 follow-up right mammogram with ultrasound showed possible right breast distortion, fibrocystic changes with biopsy recommended. 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  Labs:       Component Value Date/Time   NA 140 03/19/2023 0936   K 3.8 03/19/2023 0936   CL 106 03/19/2023 0936   CO2 27 03/19/2023 0936   GLUCOSE 92 03/19/2023 0936   BUN 10 03/19/2023 0936   CREATININE 0.90 03/19/2023 0936   CALCIUM 9.1 03/19/2023 0936   PROT 6.8 03/19/2023 0936   ALBUMIN 3.9 04/04/2021 1054   AST 14 03/19/2023 0936   ALT 12 03/19/2023 0936   ALKPHOS 45 04/04/2021 1054   BILITOT 1.8 (H) 03/19/2023 0936   GFRNONAA >60 04/04/2021 1054   GFRNONAA 59 (L) 03/22/2020 0945   GFRAA 68 03/22/2020 0945     Lab Results  Component Value Date   WBC 6.0 03/19/2023   HGB 12.6 03/19/2023   HCT 38.0 03/19/2023   MCV 91.1 03/19/2023   PLT 306 03/19/2023    Lab Results  Component Value  Date   CHOL 201 (H) 03/19/2023   HDL 74 03/19/2023   LDLCALC 113 (H) 03/19/2023   TRIG 62 03/19/2023   CHOLHDL 2.7 03/19/2023    No results found for: "HGBA1C"   Lab Results  Component Value Date   TSH 0.88 03/19/2023      Assessment & Plan:   Elevated cholesterol: she will work on controlling with healthy diet and exercise. CHOL elevated at 201. LDL elevated at 113 on 03/19/23, up from 106 nine months ago.  Endometriosis: she is having frequent menses and pre-menstrual cramps. Referral for Gynecology.   Iron deficiency: treated with ferrous sulfate 325 mg daily.   Vitamin D deficiency: treated with Vitamin D2 50,000 units weekly.  Gilbert's syndrome: diagnosed by Dr. Leone Payor in 2008. Total bilirubin elevated at 1.8.  Mitral regurgitation and mitral valve prolapse followed by cardiology. She is allergic to Penicillin. SBE prophylaxis recommended by Dr. Odis Hollingshead.  History of bilateral breast implants.  Breast and pelvic exams deferred to GYN physician.  Urinalysis today showed large blood. Ordered microscopic urinalysis, urine culture. Culture showed no UTI  Ordered  mammogram.  Referral for colonoscopy.  Vaccine counseling: administered tetanus booster. She will go to the pharmacy for the shingles vaccine.  Return in 1 year or as needed.    I,Alexander Ruley,acting as a Neurosurgeon for Margaree Mackintosh, MD.,have documented all relevant documentation on the behalf of Margaree Mackintosh, MD,as directed by  Margaree Mackintosh, MD while in the presence of Margaree Mackintosh, MD.   I, Margaree Mackintosh, MD, have reviewed all documentation for this visit. The documentation on 03/29/23 for the exam, diagnosis, procedures, and orders are all accurate and complete.

## 2023-03-21 LAB — URINALYSIS, MICROSCOPIC ONLY
Bacteria, UA: NONE SEEN /[HPF]
Hyaline Cast: NONE SEEN /[LPF]
Squamous Epithelial / HPF: NONE SEEN /[HPF] (ref <=5)
WBC, UA: NONE SEEN /[HPF] (ref 0–5)

## 2023-03-21 LAB — URINE CULTURE
MICRO NUMBER:: 15626789
SPECIMEN QUALITY:: ADEQUATE

## 2023-03-28 ENCOUNTER — Other Ambulatory Visit: Payer: Self-pay | Admitting: Internal Medicine

## 2023-03-28 DIAGNOSIS — Z9889 Other specified postprocedural states: Secondary | ICD-10-CM

## 2023-03-29 ENCOUNTER — Encounter: Payer: Self-pay | Admitting: Internal Medicine

## 2023-03-29 ENCOUNTER — Telehealth: Payer: Self-pay | Admitting: Cardiology

## 2023-03-29 NOTE — Telephone Encounter (Signed)
Will send to Dr Odis Hollingshead to review for necessity.  Also, pt has PCN listed as an allergy (rash).

## 2023-03-29 NOTE — Telephone Encounter (Signed)
Pt states she would like to have antibiotics for a upcoming dental procedure. Please advise

## 2023-03-29 NOTE — Patient Instructions (Signed)
Patient needs GYN referral for frequent menses. Needs colonoscopy referral for screening colonoscopy. Watch diet. Take iron supplement for hx of iron deficiency. Order place for Mammogram.

## 2023-03-30 NOTE — Telephone Encounter (Signed)
Patient is returning call.  °

## 2023-03-30 NOTE — Telephone Encounter (Signed)
Spoke with patient and she is aware of providers message. She does not need abx for her procedure.

## 2023-03-30 NOTE — Telephone Encounter (Signed)
Per guidelines - no antibiotics are needed as of now.   Marissa Tumbleson Castle, DO, [facility]

## 2023-03-30 NOTE — Telephone Encounter (Signed)
Called pt to go over Dr. Emelda Brothers note about abx for an upcoming dental procedure. Left voicemail for pt to call back.

## 2023-04-06 ENCOUNTER — Encounter: Payer: Self-pay | Admitting: Internal Medicine

## 2023-04-23 ENCOUNTER — Other Ambulatory Visit: Payer: Self-pay | Admitting: Internal Medicine

## 2023-04-23 ENCOUNTER — Ambulatory Visit
Admission: RE | Admit: 2023-04-23 | Discharge: 2023-04-23 | Disposition: A | Discharge status: Home / Self Care | Payer: BC Managed Care – PPO | Source: Ambulatory Visit | Attending: Internal Medicine | Admitting: Internal Medicine

## 2023-04-23 DIAGNOSIS — Z9889 Other specified postprocedural states: Secondary | ICD-10-CM

## 2023-04-23 DIAGNOSIS — Z1231 Encounter for screening mammogram for malignant neoplasm of breast: Secondary | ICD-10-CM | POA: Diagnosis not present

## 2023-05-17 ENCOUNTER — Telehealth: Payer: Self-pay | Admitting: *Deleted

## 2023-05-17 NOTE — Telephone Encounter (Addendum)
No show letter sent to patient as she has not called back to the office to R/S PV appt;  PV and procedure appts cx;

## 2023-05-17 NOTE — Telephone Encounter (Signed)
Attempt to reach pt for pre-visit. LM with call back #.  Will attempt to reach again in 5 min due to no other # listed in profile  Attempt to reach pt for pre-visit. LM with call back #.  Second attempt to reach pt for pre-vist unsuccessful. LM  informing her that PV has been canceled for today due to being unable to reach her.  Facility # for pt to call back. Instructed pt to call # given by end of the day and reschedule the pre-visit  with RN or the scheduled procedure will be canceled.

## 2023-05-28 ENCOUNTER — Ambulatory Visit: Payer: BC Managed Care – PPO | Admitting: Obstetrics and Gynecology

## 2023-06-12 ENCOUNTER — Encounter: Payer: Self-pay | Admitting: Internal Medicine

## 2023-10-10 ENCOUNTER — Other Ambulatory Visit: Payer: BC Managed Care – PPO

## 2023-10-10 ENCOUNTER — Other Ambulatory Visit: Payer: Self-pay

## 2023-10-10 ENCOUNTER — Telehealth: Payer: Self-pay | Admitting: Cardiology

## 2023-10-10 ENCOUNTER — Ambulatory Visit (HOSPITAL_COMMUNITY): Payer: BC Managed Care – PPO

## 2023-10-10 DIAGNOSIS — I340 Nonrheumatic mitral (valve) insufficiency: Secondary | ICD-10-CM

## 2023-10-10 DIAGNOSIS — I341 Nonrheumatic mitral (valve) prolapse: Secondary | ICD-10-CM

## 2023-10-10 NOTE — Telephone Encounter (Signed)
 New Message:       I need a new or

## 2023-11-01 ENCOUNTER — Ambulatory Visit: Payer: Self-pay | Admitting: Cardiology

## 2023-11-19 ENCOUNTER — Ambulatory Visit (HOSPITAL_COMMUNITY)
Admission: RE | Admit: 2023-11-19 | Discharge: 2023-11-19 | Disposition: A | Discharge status: Home / Self Care | Source: Ambulatory Visit | Attending: Cardiology | Admitting: Cardiology

## 2023-11-19 DIAGNOSIS — I340 Nonrheumatic mitral (valve) insufficiency: Secondary | ICD-10-CM

## 2023-11-19 DIAGNOSIS — I341 Nonrheumatic mitral (valve) prolapse: Secondary | ICD-10-CM

## 2023-11-19 LAB — ECHOCARDIOGRAM COMPLETE: S' Lateral: 3.08 cm

## 2023-11-20 ENCOUNTER — Ambulatory Visit: Payer: Self-pay

## 2023-11-20 NOTE — Telephone Encounter (Signed)
 FYI Only or Action Required?: Action required by provider: request for appointment.  Patient was last seen in primary care on 03/20/2023 by Perri Ronal PARAS, MD. Called Nurse Triage reporting Shortness of Breath. Symptoms began several days ago. Interventions attempted: Nothing. Symptoms are: unchanged.States she had SOB x 2-3 days, very fatigued. Denies chest pain or any recent illness.  Triage Disposition: See HCP Within 4 Hours (Or PCP Triage), appointment in 2 days.  Patient/caregiver understands and will follow disposition?: Yes Will go to ED for worsening of symptoms.

## 2023-11-20 NOTE — Telephone Encounter (Signed)
 Copied from CRM [id number]. Topic: Clinical - Red Word Triage >> Nov 20, 2023 10:44 AM Dawna HERO wrote: Red Word that prompted transfer to Nurse Triage: shortness of breath, fatigue, migraine a couple of days ago, says she doesn't know if she go to urgent care or wait for an appointment. Also stated the shortness of breath has gotten worse as time goes on Answer Assessment - Initial Assessment Questions 1. RESPIRATORY STATUS: Describe your breathing? (e.g., wheezing, shortness of breath, unable to speak, severe coughing)      SOB 2. ONSET: When did this breathing problem begin?      2-3 days ago 3. PATTERN Does the difficult breathing come and go, or has it been constant since it started?      Comes and goes 4. SEVERITY: How bad is your breathing? (e.g., mild, moderate, severe)    - MILD: No SOB at rest, mild SOB with walking, speaks normally in sentences, can lie down, no retractions, pulse < 100.    - MODERATE: SOB at rest, SOB with minimal exertion and prefers to sit, cannot lie down flat, speaks in phrases, mild retractions, audible wheezing, pulse 100-120.    - SEVERE: Very SOB at rest, speaks in single words, struggling to breathe, sitting hunched forward, retractions, pulse > 120      mild 5. RECURRENT SYMPTOM: Have you had difficulty breathing before? If Yes, ask: When was the last time? and What happened that time?      no 6. CARDIAC HISTORY: Do you have any history of heart disease? (e.g., heart attack, angina, bypass surgery, angioplasty)      Valve prolapse 7. LUNG HISTORY: Do you have any history of lung disease?  (e.g., pulmonary embolus, asthma, emphysema)     no 8. CAUSE: What do you think is causing the breathing problem?      unsure 9. OTHER SYMPTOMS: Do you have any other symptoms? (e.g., dizziness, runny nose, cough, chest pain, fever)     Feels tired 10. O2 SATURATION MONITOR:  Do you use an oxygen saturation monitor (pulse oximeter) at home? If  Yes, ask: What is your reading (oxygen level) today? What is your usual oxygen saturation reading? (e.g., 95%)       no 11. PREGNANCY: Is there any chance you are pregnant? When was your last menstrual period?       no 12. TRAVEL: Have you traveled out of the country in the last month? (e.g., travel history, exposures)       no  Protocols used: Breathing Difficulty-A-AH  Reason for Disposition  [1] MILD difficulty breathing (e.g., minimal/no SOB at rest, SOB with walking, pulse <100) AND [2] NEW-onset or WORSE than normal  Answer Assessment - Initial Assessment Questions 1. RESPIRATORY STATUS: Describe your breathing? (e.g., wheezing, shortness of breath, unable to speak, severe coughing)      SOB 2. ONSET: When did this breathing problem begin?      2-3 days ago 3. PATTERN Does the difficult breathing come and go, or has it been constant since it started?      Comes and goes 4. SEVERITY: How bad is your breathing? (e.g., mild, moderate, severe)    - MILD: No SOB at rest, mild SOB with walking, speaks normally in sentences, can lie down, no retractions, pulse < 100.    - MODERATE: SOB at rest, SOB with minimal exertion and prefers to sit, cannot lie down flat, speaks in phrases, mild retractions, audible wheezing, pulse  100-120.    - SEVERE: Very SOB at rest, speaks in single words, struggling to breathe, sitting hunched forward, retractions, pulse > 120      mild 5. RECURRENT SYMPTOM: Have you had difficulty breathing before? If Yes, ask: When was the last time? and What happened that time?      no 6. CARDIAC HISTORY: Do you have any history of heart disease? (e.g., heart attack, angina, bypass surgery, angioplasty)      Valve prolapse 7. LUNG HISTORY: Do you have any history of lung disease?  (e.g., pulmonary embolus, asthma, emphysema)     no 8. CAUSE: What do you think is causing the breathing problem?      unsure 9. OTHER SYMPTOMS: Do you have any  other symptoms? (e.g., dizziness, runny nose, cough, chest pain, fever)     Feels tired 10. O2 SATURATION MONITOR:  Do you use an oxygen saturation monitor (pulse oximeter) at home? If Yes, ask: What is your reading (oxygen level) today? What is your usual oxygen saturation reading? (e.g., 95%)       no 11. PREGNANCY: Is there any chance you are pregnant? When was your last menstrual period?       no 12. TRAVEL: Have you traveled out of the country in the last month? (e.g., travel history, exposures)       no  Protocols used: Breathing Difficulty-A-AH

## 2023-11-21 ENCOUNTER — Ambulatory Visit: Payer: Self-pay | Admitting: Cardiology

## 2023-11-21 NOTE — Progress Notes (Signed)
 Patient Care Team: Perri Ronal PARAS, MD as PCP - General (Internal Medicine)  Visit Date: 11/22/23  Subjective:   Chief Complaint  Patient presents with   Shortness of Breath    Fatigue. Started getting bad a few days ago. Had echocardiogram states someone told her it could be due to mitralvalveprolapse.    Vitals:   11/22/23 1526  BP: 108/78   SpO2 100%  Patient PI:Wjujdyj J Holdman,Female DOB:Jun 15, 1970,52 y.o. FMW:[id number]   53 y.o.Female presents today for acute sick visit with Shortness of Breath; Fatigue. Patient has a past medical history of Anorexia Nervosa; Abnormal Uterine Bleeding; Situational Stress; Complicated UTI (Urinary Tract Infection); Moderate Mitral Regurgitation; SBE (Subacute Bacterial Endocarditis) Prophylaxis Candidate; and MVP (Mitral Valve Prolapse) Anxiety/Depression, and Gilbert's Syndrome. Says that she has been short of breath and fatigued while at rest, even in office she is short of breath like I've been exercising, and these symptoms have made her think about going to the ED and causing more stress. 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 Saw Cardiology on 6/23 for follow-up Echo regarding her MVP and mitral regurgitation. Echo showed Left EF normal (58%) w/ moderate-severe mitral regurgitation, left ventricle normal. Horizontal color splay w/ bunting of right-sided pulmonary vein signal mitral regurgitation is moderate at least and mitral valve is myxomatous. No evidence of mitral stenosis. Follows-up again in July.   Additionally mentions that she thinks she may be going into menopause as she has been having irregular periods, LMP in April. Established with gy[facility]ologist at the [facility], which she plans to schedule with soon.   Past Medical History:  Diagnosis Date   Anxiety    Depression    Gilbert's syndrome    Mitral regurgitation    UTI  (lower urinary tract infection)     Allergies  Allergen Reactions   Other Other (See Comments)    coconut   Penicillins Rash   Sulfa Antibiotics Hives and Rash    Family History  Problem Relation Age of Onset   Diabetes Mother    Hyperlipidemia Mother    Diabetes Father    Lung cancer Father    Cancer Maternal Grandmother        breast   Diabetes Sister    Hyperlipidemia Brother    Social Hx: non-smoker. Social alcohol consumption.Married. 2 children. Works for [employer].  Family Hx: diabetes in father, mother,sister. Hyperlipidemia in brother and mother. Lung cancer in father.  Review of Systems  Constitutional:  Positive for malaise/fatigue.  Respiratory:  Positive for shortness of breath.   Cardiovascular:  Negative for leg swelling.     Objective:  Vitals: BP 108/78   Pulse (!) 53   Temp 98.7 F (37.1 C) (Temporal)   Ht 5' 9 (1.753 m)   Wt 175 lb 12.8 oz (79.7 kg)   SpO2 100%   BMI 25.96 kg/m   Physical Exam Vitals and nursing note reviewed.  Constitutional:      General: She is not in acute distress.    Appearance: Normal appearance. She is not toxic-appearing.  HENT:     Head: Normocephalic and atraumatic.   Cardiovascular:     Rate and Rhythm: Normal rate and regular rhythm. No extrasystoles are present.    Pulses: Normal pulses.     Heart sounds: Murmur heard.     Systolic (fixed split S2) murmur is  present.     No friction rub. No gallop.  Pulmonary:     Effort: Pulmonary effort is normal. No respiratory distress.     Breath sounds: Normal breath sounds. No wheezing or rales.  Abdominal:     Palpations: There is no hepatomegaly or splenomegaly.   Skin:    General: Skin is warm and dry.   Neurological:     Mental Status: She is alert and oriented to person, place, and time. Mental status is at baseline.   Psychiatric:        Mood and Affect: Mood normal.        Behavior: Behavior normal.        Thought Content: Thought content  normal.        Judgment: Judgment normal.    Results:  Studies Obtained And Personally Reviewed By Me:  2D Echo//3D Echo ( Both Spectral & Color Flow Doppler)   Sonographer Comments: Image acquisition challenging due to breast implants.  IMPRESSIONS:  1. Left ventricular ejection fraction, by estimation, is 55 to 60%. Left ventricular ejection fraction by 3D volume is 58%. The left ventricle has normal function. The left ventricle has no regional wall motion abnormalities. Left ventricular diastolic parameters are indeterminate.   2. Right ventricular systolic function is normal. The right ventricular size is normal.   3. Horizontal color splay with blunting of the right sided pulmonary vein signal Mitral regurgitation is at least moderate and appears worse in clip 69. The mitral valve is myxomatous. Moderate to severe mitral valve regurgitation. No evidence of mitral stenosis.   4. The aortic valve was not well visualized. Aortic valve regurgitation is not visualized. No aortic stenosis is present.   05/12/2022 Echo  1. Left ventricle cavity is normal in size. Mild concentric hypertrophy of the left ventricle. Normal global wall motion. Normal LV systolic function with EF 67%. Normal diastolic filling pattern.  2. Left atrial cavity is mildly dilated.  3. Myxomatous mitral valve with bileaflet prolapse. Mild (Grade I) mitral regurgitation. 4. Mild tricuspid regurgitation. 5. No evidence of pulmonary hypertension. 6. No significant change compared to previous study on 05/06/2021.   Labs:     Component Value Date/Time   NA 140 03/19/2023 0936   K 3.8 03/19/2023 0936   CL 106 03/19/2023 0936   CO2 27 03/19/2023 0936   GLUCOSE 92 03/19/2023 0936   BUN 10 03/19/2023 0936   CREATININE 0.90 03/19/2023 0936   CALCIUM 9.1 03/19/2023 0936   PROT 6.8 03/19/2023 0936   ALBUMIN 3.9 04/04/2021 1054   AST 14 03/19/2023 0936   ALT 12 03/19/2023 0936   ALKPHOS 45 04/04/2021 1054   BILITOT  1.8 (H) 03/19/2023 0936   GFRNONAA >60 04/04/2021 1054   GFRNONAA 59 (L) 03/22/2020 0945   GFRAA 68 03/22/2020 0945    Lab Results  Component Value Date   WBC 6.0 03/19/2023   HGB 12.6 03/19/2023   HCT 38.0 03/19/2023   MCV 91.1 03/19/2023   PLT 306 03/19/2023   Lab Results  Component Value Date   CHOL 201 (H) 03/19/2023   HDL 74 03/19/2023   LDLCALC 113 (H) 03/19/2023   TRIG 62 03/19/2023   CHOLHDL 2.7 03/19/2023   Lab Results  Component Value Date   TSH 0.88 03/19/2023    Assessment & Plan:  Ordering FHS, Free T4, TSH, CBC, C-MET, and Lipid Panel to be done 11/23/2023.   Shortness of Breath; Fatigue: she has been short of breath and fatigued while  at rest, even in the office , says she is short of breath like I've been exercising, and these symptoms have made her think about going to the ED and  this is causing more stress. 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 Saw Cardiology on 6/23 for follow-up Echo regarding her MVP and mitral regurgitation. Echo showed Left EF normal (58%) w/ moderate-severe mitral regurgitation, left ventricle normal. Horizontal color splay w/ bunting of right-sided pulmonary vein signal mitral regurgitation is moderate at least and mitral valve is myxomatous. No evidence of mitral stenosis. Follows-up again in July with Dr. Michele. Denies any new situational stress.Patient not scheduled to see Dr. Michele until late July. She would like sooner appointment due to symptoms she is having. She is concerned about her heart valve.  Anxiety state- worried about her cardiac status. See if Dr. Michele can see her sooner than late July. Come for labs tomorrow. May be excused from work tomorrow if she desires.  Irregular Periods: thinks she may be going into menopause as she has been having irregular periods, LMP in April. Established with gynecologist at the [facility],  which she plans to schedule with soon. Asking about menopause. This is a possibility given her age.    I,Emily Lagle,acting as a Neurosurgeon for Ronal JINNY Hailstone, MD.,have documented all relevant documentation on the behalf of Ronal JINNY Hailstone, MD,as directed by  Ronal JINNY Hailstone, MD while in the presence of Ronal JINNY Hailstone, MD.   I, Ronal JINNY Hailstone, MD, have reviewed all documentation for this visit. The documentation on 11/23/23 for the exam, diagnosis, procedures, and orders are all accurate and complete.

## 2023-11-22 ENCOUNTER — Encounter: Payer: Self-pay | Admitting: Internal Medicine

## 2023-11-22 ENCOUNTER — Ambulatory Visit (INDEPENDENT_AMBULATORY_CARE_PROVIDER_SITE_OTHER): Admitting: Internal Medicine

## 2023-11-22 VITALS — BP 108/78 | HR 53 | Temp 98.7°F | Ht 69.0 in | Wt 175.8 lb

## 2023-11-22 DIAGNOSIS — R5383 Other fatigue: Secondary | ICD-10-CM

## 2023-11-22 DIAGNOSIS — R0602 Shortness of breath: Secondary | ICD-10-CM

## 2023-11-22 DIAGNOSIS — F419 Anxiety disorder, unspecified: Secondary | ICD-10-CM | POA: Diagnosis not present

## 2023-11-22 DIAGNOSIS — I341 Nonrheumatic mitral (valve) prolapse: Secondary | ICD-10-CM

## 2023-11-23 ENCOUNTER — Ambulatory Visit
Admission: RE | Admit: 2023-11-23 | Discharge: 2023-11-23 | Disposition: A | Discharge status: Home / Self Care | Source: Ambulatory Visit | Attending: Internal Medicine | Admitting: Internal Medicine

## 2023-11-23 ENCOUNTER — Ambulatory Visit: Payer: Self-pay | Admitting: Internal Medicine

## 2023-11-23 ENCOUNTER — Other Ambulatory Visit

## 2023-11-23 ENCOUNTER — Encounter: Payer: Self-pay | Admitting: Internal Medicine

## 2023-11-23 DIAGNOSIS — R5383 Other fatigue: Secondary | ICD-10-CM | POA: Diagnosis not present

## 2023-11-23 DIAGNOSIS — Z0000 Encounter for general adult medical examination without abnormal findings: Secondary | ICD-10-CM

## 2023-11-23 DIAGNOSIS — E7800 Pure hypercholesterolemia, unspecified: Secondary | ICD-10-CM

## 2023-11-23 DIAGNOSIS — R0602 Shortness of breath: Secondary | ICD-10-CM | POA: Diagnosis not present

## 2023-11-23 DIAGNOSIS — Z13220 Encounter for screening for lipoid disorders: Secondary | ICD-10-CM

## 2023-11-23 DIAGNOSIS — Z1329 Encounter for screening for other suspected endocrine disorder: Secondary | ICD-10-CM

## 2023-11-23 NOTE — Addendum Note (Signed)
 Addended by: BEVELY CURTISTINE PARAS on: 11/23/2023 09:29 AM   Modules accepted: Orders

## 2023-11-23 NOTE — Telephone Encounter (Signed)
 Spoke with pt over the phone and explained ED precautions from Dr. Michele as well as recommendations of getting next available appt for pt. Next available DOD appt if 7/3. Scheduled pt for that day. Pt states she is not sure if husband will be able to attend d/t work.

## 2023-11-23 NOTE — Telephone Encounter (Signed)
-----   Message from [facility] sent at 11/23/2023  8:12 AM EDT ----- Sure, Dr. Perri.    Keven - please bring her in at the next available have her husband accompany her. Also, is she had worsening shortness of breath, difficulty laying flat, or fluid retention please go to ER.   Madonna Large, DO, [facility] ----- Message ----- From: Perri Ronal PARAS, MD Sent: 11/23/2023  12:27 AM EDT To: Madonna Large, DO  Can you see her sooner than late July due to symptoms? Thanks, MJB

## 2023-11-23 NOTE — Patient Instructions (Signed)
 Have labs drawn here tomorrow due to fatigue. We will ask Dr. Michele if her appt can be moved up sooner than late July since she is having symptoms.

## 2023-11-24 ENCOUNTER — Ambulatory Visit: Payer: Self-pay | Admitting: Internal Medicine

## 2023-11-24 LAB — COMPLETE METABOLIC PANEL WITHOUT GFR
AG Ratio: 2.2 (calc) (ref 1.0–2.5)
ALT: 10 U/L (ref 6–29)
AST: 14 U/L (ref 10–35)
Albumin: 4.4 g/dL (ref 3.6–5.1)
Alkaline phosphatase (APISO): 46 U/L (ref 37–153)
BUN: 9 mg/dL (ref 7–25)
CO2: 28 mmol/L (ref 20–32)
Calcium: 9 mg/dL (ref 8.6–10.4)
Chloride: 107 mmol/L (ref 98–110)
Creat: 0.85 mg/dL (ref 0.50–1.03)
Globulin: 2 g/dL (ref 1.9–3.7)
Glucose, Bld: 92 mg/dL (ref 65–99)
Potassium: 4.7 mmol/L (ref 3.5–5.3)
Sodium: 140 mmol/L (ref 135–146)
Total Bilirubin: 1.4 mg/dL — ABNORMAL HIGH (ref 0.2–1.2)
Total Protein: 6.4 g/dL (ref 6.1–8.1)

## 2023-11-24 LAB — LIPID PANEL
Cholesterol: 198 mg/dL (ref <200)
HDL: 78 mg/dL (ref >=50)
LDL Cholesterol (Calc): 104 mg/dL — ABNORMAL HIGH
Non-HDL Cholesterol (Calc): 120 mg/dL (ref <130)
Total CHOL/HDL Ratio: 2.5 (calc) (ref <5.0)
Triglycerides: 71 mg/dL (ref <150)

## 2023-11-24 LAB — CBC WITH DIFFERENTIAL/PLATELET
Absolute Lymphocytes: 2148 {cells}/uL (ref 850–3900)
Absolute Monocytes: 330 {cells}/uL (ref 200–950)
Basophils Absolute: 42 {cells}/uL (ref 0–200)
Basophils Relative: 0.7 %
Eosinophils Absolute: 102 {cells}/uL (ref 15–500)
Eosinophils Relative: 1.7 %
HCT: 41.2 % (ref 35.0–45.0)
Hemoglobin: 13.7 g/dL (ref 11.7–15.5)
MCH: 30.9 pg (ref 27.0–33.0)
MCHC: 33.3 g/dL (ref 32.0–36.0)
MCV: 92.8 fL (ref 80.0–100.0)
MPV: 10.5 fL (ref 7.5–12.5)
Monocytes Relative: 5.5 %
Neutro Abs: 3378 {cells}/uL (ref 1500–7800)
Neutrophils Relative %: 56.3 %
Platelets: 249 10*3/uL (ref 140–400)
RBC: 4.44 10*6/uL (ref 3.80–5.10)
RDW: 13 % (ref 11.0–15.0)
Total Lymphocyte: 35.8 %
WBC: 6 10*3/uL (ref 3.8–10.8)

## 2023-11-24 LAB — T4, FREE: Free T4: 1 ng/dL (ref 0.8–1.8)

## 2023-11-24 LAB — TSH: TSH: 0.92 m[IU]/L

## 2023-11-28 ENCOUNTER — Encounter: Payer: Self-pay | Admitting: Cardiology

## 2023-11-28 ENCOUNTER — Ambulatory Visit: Attending: Cardiology | Admitting: Cardiology

## 2023-11-28 VITALS — BP 106/72 | HR 59 | Ht 69.0 in | Wt 175.0 lb

## 2023-11-28 DIAGNOSIS — I341 Nonrheumatic mitral (valve) prolapse: Secondary | ICD-10-CM

## 2023-11-28 DIAGNOSIS — G4733 Obstructive sleep apnea (adult) (pediatric): Secondary | ICD-10-CM

## 2023-11-28 DIAGNOSIS — I340 Nonrheumatic mitral (valve) insufficiency: Secondary | ICD-10-CM | POA: Diagnosis not present

## 2023-11-28 NOTE — Progress Notes (Signed)
 Cardiology Office Note:  .   Date:  11/30/2023  ID:  Marissa Lewis, DOB 1970-08-26, MRN [id number] PCP/ Referring Provider: Perri Ronal JINNY, MD  [facility] Providers Cardiologist:  Marissa Clay, MD     Chief Complaint  Patient presents with   Follow-up    Here to discuss test results.   Cardiac Valve Problem    Progression of mitral valve prolapse-regurgitation; noting exertional dyspnea and fatigue    Patient Profile: .     Marissa Lewis is a 53 y.o. femalewith a PMH notable for Delford was mitral valve with progression of mitral prolapse/MR to moderate to severe who presents here for work in visit to discuss test results at the request of Marissa Lewis as a sooner than expected f/u.  She has been followed by Marissa Lewis for moderate MR with bileaflet prolapse of her myxomatous mitral valve, originally referred by Marissa Ronal JINNY, MD..  Last seen June 2024-echo at that time showed mild MR.  No angina or heart failure symptoms.  Plan was for annual follow-up after echocardiogram     Marissa Lewis was scheduled to be seen by me after echocardiogram results reviewed.  Subjective  Discussed the use of AI scribe software for clinical note transcription with the patient, who gave verbal consent to proceed.  History of Present Illness History of Present Illness The patient is a 53 year old with mitral valve prolapse who presents with symptoms of heart failure.  Her recent echocardiogram revealed moderate to severe mitral valve regurgitation.   She experiences shortness of breath and fatigue, now with less than usual activity.--Symptoms consistent with heart failure symptoms.   She has a history of sleep apnea and experiences coughing when lying flat.  With CPAP and she has not had any PND symptoms. She denies swelling in his legs, waking up at night due to shortness of breath, or recent palpitations.  She has not had any chest pain or pressure with rest or exertion although she  has had notable exertional dyspnea and orthopnea.  Se recently had blood work done, which showed an LDL level of 104, lower than previous levels.  She denies smoking and reports that her blood pressure and cholesterol are generally well-controlled.  Family history is significant for his father having undergone emergency bypass surgery and valve replacement.  Cardiovascular ROS: positive for - dyspnea on exertion, orthopnea, shortness of breath, and heart beats faster than usual, but does not necessarily note irregularity. negative for - chest pain, edema, irregular heartbeat, palpitations, paroxysmal nocturnal dyspnea, or lightheadedness or dizziness, wooziness, syncope/near syncope or TIA/amaurosis fugax, claudication  ROS:  Review of Systems - Negative except symptoms noted above most notably fatigue and exertional dyspnea    Objective   Current medications That: Ferrous sulfate 325 mg daily, ergocalciferol  1.25 mg weekly.  PRN Advil 200 mg.  Multivitamin daily.  Zinc 50 mg daily, probiotic daily.  Studies Reviewed: Marissa Lewis   EKG Interpretation Date/Time:  Wednesday November 28 2023 09:28:34 EDT Ventricular Rate:  59 PR Interval:  158 QRS Duration:  82 QT Interval:  432 QTC Calculation: 427 R Axis:   -21  Text Interpretation: Sinus bradycardia Possible Inferior infarct , age undetermined Possible Anterior infarct (cited on or before 04-Apr-2021) When compared with ECG of 04-Apr-2021 10:37, Borderline criteria for Inferior infarct are now Present Confirmed by Marissa Lewis ([id number]) on 11/28/2023 9:35:26 AM    ECHO (personally reviewed images and discussed with structural heart team): Normal LV size  and function.  EF 55 to 60%.  No RWMA.  Indeterminate diastolic parameters.  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  (Recommend TEE) EXT/GTT: Exercised 5:24 minutes (7 METS.  87% max protected heart rate.  EKG was positive for ischemia with a Duke treadmill score of 0.4-likely poor  functional capacity-INTERMEDIATE RISK.  ST and T wave changes (05/06/2021)  Lab Results  Component Value Date   CHOL 198 11/23/2023   HDL 78 11/23/2023   LDLCALC 104 (H) 11/23/2023   TRIG 71 11/23/2023   CHOLHDL 2.5 11/23/2023   Lab Results  Component Value Date   NA 140 11/23/2023   K 4.7 11/23/2023   CREATININE 0.85 11/23/2023   EGFR 78 03/19/2023   GLUCOSE 92 11/23/2023    Risk Assessment/Calculations:          Physical Exam:   VS:  BP 106/72   Pulse (!) 59   Ht 5' 9 (1.753 m)   Wt 175 lb (79.4 kg)   SpO2 98%   BMI 25.84 kg/m    Wt Readings from Last 3 Encounters:  11/28/23 175 lb (79.4 kg)  11/22/23 175 lb 12.8 oz (79.7 kg)  03/20/23 174 lb (78.9 kg)    GEN: Well nourished, well groomed in no acute distress; healthy-appearing.  Somewhat anxious but not unexpected. NECK: No JVD; No carotid bruits CARDIAC:  RRR; Normal S1, S2;no rubs, gallops; 2/6 HSM at RUSB. RESPIRATORY:  Clear to auscultation without rales, wheezing or rhonchi ; nonlabored, good air movement. ABDOMEN: Soft, non-tender, non-distended EXTREMITIES:  No edema; No deformity      ASSESSMENT AND PLAN: .    Problem List Items Addressed This Visit       Cardiology Problems   Moderate mitral regurgitation - Primary (Chronic)   By recent echo, concerned that there is progression to moderate-severe MR.  There is evidence of blunted pulmonary venous, and symptoms are concerning for pulmonary congestion/heart failure. I discussed the patient and her echo findings with Dr. Wonda from structural heart team -based on this discussion, the following recommendation were to proceed with TEE (structural imager), as well as R LHC as part of workup.  He did not feel that the nature of her MVP/MR would be feasible for TEER based on her early age, and lack of other comorbidities.  He suggested that if she desires minimally invasive approach, would send to Dr Marissa [facility] at [facility].   - Thankfully, she is not hypertensive or  tachycardic with no signs of arrhythmia.  No need for afterload reduction or diuretic at this point.  Would simply continue workup.  - Order TEE to assess mitral valve structure and function => Will Need to Be Performed by Structural Heart Team Member which may determine timing of procedure.  - Coordinate with structural heart team for TEE and catheterization scheduling. - Will complete workup with Right and Left Heart Catheterization (R LHC) to evaluate heart pressures and coronary arteries. - Likely neck step would be referral to [facility] for minimally invasive procedure if preferred.  See informed consent noted below       Relevant Orders   EKG 12-Lead (Completed)   CBC   Basic metabolic panel with GFR   MVP (mitral valve prolapse) (Chronic)   Mitral prolapse noted on echo with myxomatous mitral valve.  Now with MR progressing to possible moderate to severe.  Will plan TEE to be performed by Structural Heart Imaging Specialist along with Right and Left Heart Catheterization to be performed either  myself or Dr. Wonda or Wendel (both of the structural heart team)   Thankfully, no signs symptoms of palpitations or irregular heartbeats.      Relevant Orders   CBC   Basic metabolic panel with GFR     Other   OSA (obstructive sleep apnea) (Chronic)   Continue to use CPAP to help assist with symptoms of CHF when lying down.            Informed Consent   Shared Decision Making/Informed Consent The risks, including but not limited to, [bleeding or vascular complications (1 in 500), pneumothorax (1 in 1600), arrhythmia (1 in 1000) and death (1 in 5000)], benefits (diagnostic support and/or management of heart failure, pulmonary hypertension) and alternatives of a right heart catheterization were discussed in detail with Marissa Lewis and she is willing to proceed. The risks [stroke (1 in 1000), death (1 in 1000), kidney failure [usually temporary] (1 in 500), bleeding (1 in 200), allergic  reaction [possibly serious] (1 in 200)], benefits (diagnostic support and management of coronary artery disease) and alternatives of a cardiac catheterization were discussed in detail with Marissa Lewis and she is willing to proceed.     The risks [esophageal damage, perforation (1:10,000 risk), bleeding, pharyngeal hematoma as well as other potential complications associated with conscious sedation including aspiration, arrhythmia, respiratory failure and death], benefits (treatment guidance and diagnostic support) and alternatives of a transesophageal echocardiogram were discussed in detail with Marissa Lewis and she is willing to proceed.       Follow-Up: Return in about 3 weeks (around 12/19/2023) for Post Follow-up with either myself or Marissa Lewis.  Total time spent: 34 min spent with patient + 28 min spent charting = 62 min I spent 62 minutes in the care of [employer] today including reviewing labs (1 minute), reviewing studies (previous and current echocardiogram reviewed-images personally reviewed and discussed with structural heart team-pulm minutes), reviewing outside studies (original echo from [facility] included in 12 minutes above), face to face time discussing treatment options (34), reviewing records from note from Dr. Leane (3 minutes), 12 minutes discussing plan with structural heart team/decision making, along with dictating, and documenting in the encounter. Extensive time spent discussing the workup and likely treatment for her mitral valve prolapse.  We discussed TEE as well as right and left heart cath.  We also discussed referral for minimally invasive valve surgery.  Indicated that she would likely not be a candidate for TEER based on her young age and lack of comorbidity.    Signed, Marissa MICAEL Clay, MD, MS Marissa Lewis, M.D., M.S. Interventional Chartered certified accountant  Pager # [phone]

## 2023-11-28 NOTE — H&P (View-Only) (Signed)
 Cardiology Office Note:  .   Date:  11/30/2023  ID:  [name], DOB 1970-08-26, MRN [id number] PCP/ Referring Provider: Perri Ronal JINNY, MD  [facility] Providers Cardiologist:  Alm Clay, MD     Chief Complaint  Patient presents with   Follow-up    Here to discuss test results.   Cardiac Valve Problem    Progression of mitral valve prolapse-regurgitation; noting exertional dyspnea and fatigue    Patient Profile: .     KRYSIA ZAHRADNIK is a 53 y.o. femalewith a PMH notable for Delford was mitral valve with progression of mitral prolapse/MR to moderate to severe who presents here for work in visit to discuss test results at the request of Dr. Michele as a sooner than expected f/u.  She has been followed by Dr. 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  Last seen June 2024-echo at that time showed mild MR.  No angina or heart failure symptoms.  Plan was for annual follow-up after echocardiogram     [name] was scheduled to be seen by me after echocardiogram results reviewed.  Subjective  Discussed the use of AI scribe software for clinical note transcription with the patient, who gave verbal consent to proceed.  History of Present Illness History of Present Illness The patient is a 53 year old with mitral valve prolapse who presents with symptoms of heart failure.  Her recent echocardiogram revealed moderate to severe mitral valve regurgitation.   She experiences shortness of breath and fatigue, now with less than usual activity.--Symptoms consistent with heart failure symptoms.   She has a history of sleep apnea and experiences coughing when lying flat.  With CPAP and she has not had any PND symptoms. She denies swelling in his legs, waking up at night due to shortness of breath, or recent palpitations.  She has not had any chest pain or pressure with rest or exertion although she  has had notable exertional dyspnea and orthopnea.  Se recently had blood work done, which showed an LDL level of 104, lower than previous levels.  She denies smoking and reports that her blood pressure and cholesterol are generally well-controlled.  Family history is significant for his father having undergone emergency bypass surgery and valve replacement.  Cardiovascular ROS: positive for - dyspnea on exertion, orthopnea, shortness of breath, and heart beats faster than usual, but does not necessarily note irregularity. negative for - chest pain, edema, irregular heartbeat, palpitations, paroxysmal nocturnal dyspnea, or lightheadedness or dizziness, wooziness, syncope/near syncope or TIA/amaurosis fugax, claudication  ROS:  Review of Systems - Negative except symptoms noted above most notably fatigue and exertional dyspnea    Objective   Current medications That: Ferrous sulfate 325 mg daily, ergocalciferol  1.25 mg weekly.  PRN Advil 200 mg.  Multivitamin daily.  Zinc 50 mg daily, probiotic daily.  Studies Reviewed: SABRA   EKG Interpretation Date/Time:  Wednesday November 28 2023 09:28:34 EDT Ventricular Rate:  59 PR Interval:  158 QRS Duration:  82 QT Interval:  432 QTC Calculation: 427 R Axis:   -21  Text Interpretation: Sinus bradycardia Possible Inferior infarct , age undetermined Possible Anterior infarct (cited on or before 04-Apr-2021) When compared with ECG of 04-Apr-2021 10:37, Borderline criteria for Inferior infarct are now Present Confirmed by Clay Alm ([id number]) on 11/28/2023 9:35:26 AM    ECHO (personally reviewed images and discussed with structural heart team): Normal LV size  and function.  EF 55 to 60%.  No RWMA.  Indeterminate diastolic parameters.  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  (Recommend TEE) EXT/GTT: Exercised 5:24 minutes (7 METS.  87% max protected heart rate.  EKG was positive for ischemia with a Duke treadmill score of 0.4-likely poor  functional capacity-INTERMEDIATE RISK.  ST and T wave changes (05/06/2021)  Lab Results  Component Value Date   CHOL 198 11/23/2023   HDL 78 11/23/2023   LDLCALC 104 (H) 11/23/2023   TRIG 71 11/23/2023   CHOLHDL 2.5 11/23/2023   Lab Results  Component Value Date   NA 140 11/23/2023   K 4.7 11/23/2023   CREATININE 0.85 11/23/2023   EGFR 78 03/19/2023   GLUCOSE 92 11/23/2023    Risk Assessment/Calculations:          Physical Exam:   VS:  BP 106/72   Pulse (!) 59   Ht 5' 9 (1.753 m)   Wt 175 lb (79.4 kg)   SpO2 98%   BMI 25.84 kg/m    Wt Readings from Last 3 Encounters:  11/28/23 175 lb (79.4 kg)  11/22/23 175 lb 12.8 oz (79.7 kg)  03/20/23 174 lb (78.9 kg)    GEN: Well nourished, well groomed in no acute distress; healthy-appearing.  Somewhat anxious but not unexpected. NECK: No JVD; No carotid bruits CARDIAC:  RRR; Normal S1, S2;no rubs, gallops; 2/6 HSM at RUSB. RESPIRATORY:  Clear to auscultation without rales, wheezing or rhonchi ; nonlabored, good air movement. ABDOMEN: Soft, non-tender, non-distended EXTREMITIES:  No edema; No deformity      ASSESSMENT AND PLAN: .    Problem List Items Addressed This Visit       Cardiology Problems   Moderate mitral regurgitation - Primary (Chronic)   By recent echo, concerned that there is progression to moderate-severe MR.  There is evidence of blunted pulmonary venous, and symptoms are concerning for pulmonary congestion/heart failure. I discussed the patient and her echo findings with Dr. Wonda from structural heart team -based on this discussion, the following recommendation were to proceed with TEE (structural imager), as well as R LHC as part of workup.  He did not feel that the nature of her MVP/MR would be feasible for TEER based on her early age, and lack of other comorbidities.  He suggested that if she desires minimally invasive approach, would send to Dr [facility] at [facility].   - Thankfully, she is not hypertensive or  tachycardic with no signs of arrhythmia.  No need for afterload reduction or diuretic at this point.  Would simply continue workup.  - Order TEE to assess mitral valve structure and function => Will Need to Be Performed by Structural Heart Team Member which may determine timing of procedure.  - Coordinate with structural heart team for TEE and catheterization scheduling. - Will complete workup with Right and Left Heart Catheterization (R LHC) to evaluate heart pressures and coronary arteries. - Likely neck step would be referral to [facility] for minimally invasive procedure if preferred.  See informed consent noted below       Relevant Orders   EKG 12-Lead (Completed)   CBC   Basic metabolic panel with GFR   MVP (mitral valve prolapse) (Chronic)   Mitral prolapse noted on echo with myxomatous mitral valve.  Now with MR progressing to possible moderate to severe.  Will plan TEE to be performed by Structural Heart Imaging Specialist along with Right and Left Heart Catheterization to be performed either  myself or Dr. Wonda or Wendel (both of the structural heart team)   Thankfully, no signs symptoms of palpitations or irregular heartbeats.      Relevant Orders   CBC   Basic metabolic panel with GFR     Other   OSA (obstructive sleep apnea) (Chronic)   Continue to use CPAP to help assist with symptoms of CHF when lying down.            Informed Consent   Shared Decision Making/Informed Consent The risks, including but not limited to, [bleeding or vascular complications (1 in 500), pneumothorax (1 in 1600), arrhythmia (1 in 1000) and death (1 in 5000)], benefits (diagnostic support and/or management of heart failure, pulmonary hypertension) and alternatives of a right heart catheterization were discussed in detail with Ms. Parcher and she is willing to proceed. The risks [stroke (1 in 1000), death (1 in 1000), kidney failure [usually temporary] (1 in 500), bleeding (1 in 200), allergic  reaction [possibly serious] (1 in 200)], benefits (diagnostic support and management of coronary artery disease) and alternatives of a cardiac catheterization were discussed in detail with Ms. Hauth and she is willing to proceed.     The risks [esophageal damage, perforation (1:10,000 risk), bleeding, pharyngeal hematoma as well as other potential complications associated with conscious sedation including aspiration, arrhythmia, respiratory failure and death], benefits (treatment guidance and diagnostic support) and alternatives of a transesophageal echocardiogram were discussed in detail with Ms. Kolek and she is willing to proceed.       Follow-Up: Return in about 3 weeks (around 12/19/2023) for Post Follow-up with either myself or Dr. Michele.  Total time spent: 34 min spent with patient + 28 min spent charting = 62 min I spent 62 minutes in the care of [employer] today including reviewing labs (1 minute), reviewing studies (previous and current echocardiogram reviewed-images personally reviewed and discussed with structural heart team-pulm minutes), reviewing outside studies (original echo from [facility] included in 12 minutes above), face to face time discussing treatment options (34), reviewing records from note from Dr. Leane (3 minutes), 12 minutes discussing plan with structural heart team/decision making, along with dictating, and documenting in the encounter. Extensive time spent discussing the workup and likely treatment for her mitral valve prolapse.  We discussed TEE as well as right and left heart cath.  We also discussed referral for minimally invasive valve surgery.  Indicated that she would likely not be a candidate for TEER based on her young age and lack of comorbidity.    Signed, Alm MICAEL Clay, MD, MS Alm Clay, M.D., M.S. Interventional Chartered certified accountant  Pager # [phone]

## 2023-11-28 NOTE — Patient Instructions (Addendum)
 Medication Instructions:   No changes *If you need a refill on your cardiac medications before your next appointment, please call your pharmacy*   Lab Work: CBC BMP If you have labs (blood work) drawn today and your tests are completely normal, you will receive your results only by: MyChart Message (if you have MyChart) OR A paper copy in the mail If you have any lab test that is abnormal or we need to change your treatment, we will call you to review the results.   Testing/Procedures: both the same day Your physician has requested that you have a TEE. During a TEE, sound waves are used to create images of your heart. It provides your doctor with information about the size and shape of your heart and how well your heart's chambers and valves are working. In this test, a transducer is attached to the end of a flexible tube that's guided down your throat and into your esophagus (the tube leading from you mouth to your stomach) to get a more detailed image of your heart. You are not awake for the procedure. Please see the instruction sheet given to you today  And  Your physician has requested that you have a cardiac catheterization. Cardiac catheterization is used to diagnose and/or treat various heart conditions. Doctors may recommend this procedure for a number of different reasons. The most common reason is to evaluate chest pain. Chest pain can be a symptom of coronary artery disease (CAD), and cardiac catheterization can show whether plaque is narrowing or blocking your heart's arteries. This procedure is also used to evaluate the valves, as well as measure the blood flow and oxygen levels in different parts of your heart. For further information please visit [email or web address] Please follow instruction sheet, as given.   Follow-Up: At [facility] you and your health needs are our priority.  As part of our continuing mission to provide you with exceptional heart care, we have created  designated Provider Care Teams.  These Care Teams include your primary Cardiologist (physician) and Advanced Practice Providers (APPs -  Physician Assistants and Nurse Practitioners) who all work together to provide you with the care you need, when you need it.     Your next appointment:   3 week(s) after procedure is complete  The format for your next appointment:   In Person  Provider:   Alm Clay, MD   Other Instructions     Dear Marissa Lewis  You are scheduled for a TEE (Transesophageal Echocardiogram) on ,  TBD with Dr. NELLIE.  Please arrive at the [facility] (Main Entrance A) at [facility]: [address] [place], [place] [address] at TBD (This time is 2 hour(s) before your procedure to ensure your preparation).   Free valet parking service is available. You will check in at ADMITTING.   *Please Note: You will receive a call the day before your procedure to confirm the appointment time. That time may have changed from the original time based on the schedule for that day.*    DIET:  Nothing to eat or drink after midnight except a sip of water with medications (see medication instructions below)  MEDICATION INSTRUCTIONS: !!IF ANY NEW MEDICATIONS ARE STARTED AFTER TODAY, PLEASE NOTIFY YOUR PROVIDER AS SOON AS POSSIBLE!!  FYI: Medications such as Semaglutide (Ozempic, [place]), Tirzepatide (Mounjaro, Zepbound), Dulaglutide (Trulicity), etc (GLP1 agonists) AND Canagliflozin (Invokana), Dapagliflozin (Farxiga), Empagliflozin (Jardiance), Ertugliflozin (Steglatro), Bexagliflozin [employer]) or any combination with one of these drugs such as  Invokamet (Canagliflozin/Metformin), Synjardy (Empagliflozin/Metformin), etc (SGLT2 inhibitors) must be held around the time of a procedure. This is not a comprehensive list of all of these drugs. Please review all of your medications and talk to your provider if you take any one of these. If you are not sure, ask your provider.         LABS:   Come to the lab ( CBC,BMP) at the [facility] ([address] [place], 1st Floor) between the hours of 8:00 am and 4:30 pm. You do NOT have to be fasting.  FYI:  For your safety, and to allow us  to monitor your vital signs accurately during the surgery/procedure we request: If you have artificial nails, gel coating, SNS etc, please have those removed prior to your surgery/procedure. Not having the nail coverings /polish removed may result in cancellation or delay of your surgery/procedure.  Your support person will be asked to wait in the waiting room during your procedure.  It is OK to have someone drop you off and come back when you are ready to be discharged.  You cannot drive after the procedure and will need someone to drive you home.  Bring your insurance cards.  *Special Note: Every effort is made to have your procedure done on time. Occasionally there are emergencies that occur at the hospital that may cause delays. Please be patient if a delay does occur.          [facility] A DEPT OF [facility] AT [address] A DEPT OF THE [place] [address] [place] [address] Dept: [phone] Loc: [phone]  Marissa Lewis  11/28/2023  You are scheduled for a Cardiac Catheterization on TBD , TBD  Date  +TBD with Dr. Alm Clay.  1. Please arrive at the [facility] (Main Entrance A) at [facility]: [address] [place], [place] [address] at TBD (This time is 2 hour(s) before your procedure to ensure your preparation).   Free valet parking service is available. You will check in at ADMITTING. The support person will be asked to wait in the waiting room.  It is OK to have someone drop you off and come back when you are ready to be discharged.    Special note: Every effort is made to have your procedure done on time. Please understand that emergencies  sometimes delay scheduled procedures.  2. Diet: Do not eat solid foods after midnight.  The patient may have clear liquids until 5am upon the day of the procedure.  3. Labs: none  4. Medication instructions in preparation for your procedure:   Contrast Allergy: No   On the morning of your procedure, take your Aspirin 81 mg and any morning medicines NOT listed above.  You may use sips of water.  5. Plan to go home the same day, you will only stay overnight if medically necessary. 6. Bring a current list of your medications and current insurance cards. 7. You MUST have a responsible person to drive you home. 8. Someone MUST be with you the first 24 hours after you arrive home or your discharge will be delayed. 9. Please wear clothes that are easy to get on and off and wear slip-on shoes.  Thank you for allowing us  to care for you!   -- [facility]

## 2023-11-30 ENCOUNTER — Encounter: Payer: Self-pay | Admitting: Cardiology

## 2023-11-30 DIAGNOSIS — G4733 Obstructive sleep apnea (adult) (pediatric): Secondary | ICD-10-CM | POA: Insufficient documentation

## 2023-11-30 NOTE — Assessment & Plan Note (Signed)
 Mitral prolapse noted on echo with myxomatous mitral valve.  Now with MR progressing to possible moderate to severe.  Will plan TEE to be performed by Structural Heart Imaging Specialist along with Right and Left Heart Catheterization to be performed either myself or Dr. Wonda or Wendel (both of the structural heart team)   Thankfully, no signs symptoms of palpitations or irregular heartbeats.

## 2023-11-30 NOTE — Assessment & Plan Note (Signed)
 Continue to use CPAP to help assist with symptoms of CHF when lying down.

## 2023-11-30 NOTE — Assessment & Plan Note (Addendum)
 By recent echo, concerned that there is progression to moderate-severe MR.  There is evidence of blunted pulmonary venous, and symptoms are concerning for pulmonary congestion/heart failure. I discussed the patient and her echo findings with Dr. Wonda from structural heart team -based on this discussion, the following recommendation were to proceed with TEE (structural imager), as well as R LHC as part of workup.  He did not feel that the nature of her MVP/MR would be feasible for TEER based on her early age, and lack of other comorbidities.  He suggested that if she desires minimally invasive approach, would send to Dr Ricky at [facility].   - Thankfully, she is not hypertensive or tachycardic with no signs of arrhythmia.  No need for afterload reduction or diuretic at this point.  Would simply continue workup.  - Order TEE to assess mitral valve structure and function => Will Need to Be Performed by Structural Heart Team Member which may determine timing of procedure.  - Coordinate with structural heart team for TEE and catheterization scheduling. - Will complete workup with Right and Left Heart Catheterization (R LHC) to evaluate heart pressures and coronary arteries. - Likely neck step would be referral to [facility] for minimally invasive procedure if preferred.  See informed consent noted below

## 2023-12-03 ENCOUNTER — Telehealth: Payer: Self-pay | Admitting: *Deleted

## 2023-12-03 NOTE — Telephone Encounter (Signed)
 Called and left message on voicemail . Dr Anner is still awaiting confirmation with another provider to do TEE.   Informed will call once RN has a date for procedure  TEE and right and left heart cath to schedule.

## 2023-12-04 ENCOUNTER — Telehealth: Payer: Self-pay | Admitting: *Deleted

## 2023-12-04 NOTE — Telephone Encounter (Signed)
 I am okay w/ the switch but please discuss w/ Dr. Anner.   Dr. Airyana Sprunger

## 2023-12-04 NOTE — Telephone Encounter (Signed)
 RN called and spoke to patient.  Patient is aware of the schedule procedures.  She is also aware of direction of procedures and that the written instruction have been sent to patient via mychart.   Aware that she needs the labs done this week .  All question was answered.

## 2023-12-04 NOTE — Telephone Encounter (Signed)
 Once patient received information  and direction for upcoming procedures Patient agreed to proceed with procedures  Patient request to switch primary cardiologist  from Dr Michele to Dr  Anner.   RN informed patient  to keep her appointment with Dr Michele 12/19/23. This will be the day after TEE , Right and Left Heart Cath.

## 2023-12-04 NOTE — Addendum Note (Signed)
 Addended by: GLADIS REENA GAILS on: 12/04/2023 04:27 PM   Modules accepted: Orders

## 2023-12-05 NOTE — Telephone Encounter (Signed)
 Certainly was not my intention to have this patient switch to me but if that is what she wants I am okay with it.  I think she has been well-managed.  Dh

## 2023-12-05 NOTE — Telephone Encounter (Signed)
 Called left message for patient to take 81 mg aspirin morning of procedure with sip of water between 5am -6 am  Any question may call back

## 2023-12-07 DIAGNOSIS — I340 Nonrheumatic mitral (valve) insufficiency: Secondary | ICD-10-CM | POA: Diagnosis not present

## 2023-12-07 DIAGNOSIS — I341 Nonrheumatic mitral (valve) prolapse: Secondary | ICD-10-CM | POA: Diagnosis not present

## 2023-12-07 LAB — CBC

## 2023-12-08 LAB — BASIC METABOLIC PANEL WITH GFR
BUN/Creatinine Ratio: 11 (ref 9–23)
BUN: 9 mg/dL (ref 6–24)
CO2: 19 mmol/L — ABNORMAL LOW (ref 20–29)
Calcium: 8.9 mg/dL (ref 8.7–10.2)
Chloride: 107 mmol/L — ABNORMAL HIGH (ref 96–106)
Creatinine, Ser: 0.85 mg/dL (ref 0.57–1.00)
Glucose: 85 mg/dL (ref 70–99)
Potassium: 4.3 mmol/L (ref 3.5–5.2)
Sodium: 142 mmol/L (ref 134–144)
eGFR: 83 mL/min/1.73 (ref >59)

## 2023-12-08 LAB — CBC
Hematocrit: 41 (ref 34.0–46.6)
Hemoglobin: 13.2 g/dL (ref 11.1–15.9)
MCH: 30.3 pg (ref 26.6–33.0)
MCHC: 32.2 g/dL (ref 31.5–35.7)
MCV: 94 fL (ref 79–97)
Platelets: 269 x10E3/uL (ref 150–450)
RBC: 4.36 x10E6/uL (ref 3.77–5.28)
RDW: 13.3 (ref 11.7–15.4)
WBC: 5.9 x10E3/uL (ref 3.4–10.8)

## 2023-12-11 ENCOUNTER — Ambulatory Visit: Payer: Self-pay | Admitting: Cardiology

## 2023-12-17 ENCOUNTER — Telehealth: Payer: Self-pay | Admitting: *Deleted

## 2023-12-17 NOTE — Anesthesia Preprocedure Evaluation (Signed)
 Anesthesia Evaluation  Patient identified by MRN, date of birth, ID band Patient awake    Reviewed: Allergy & Precautions, NPO status , Patient's Chart, lab work & pertinent test results  Airway Mallampati: II  TM Distance: >3 FB Neck ROM: Full    Dental  (+) Partial Upper, Dental Advisory Given   Pulmonary sleep apnea    Pulmonary exam normal breath sounds clear to auscultation       Cardiovascular negative cardio ROS  Rhythm:Regular Rate:Normal + Systolic murmurs    Neuro/Psych  PSYCHIATRIC DISORDERS Anxiety Depression    negative neurological ROS     GI/Hepatic negative GI ROS,,,Gilbert's syndrome   Endo/Other  negative endocrine ROS    Renal/GU negative Renal ROS     Musculoskeletal negative musculoskeletal ROS (+)    Abdominal   Peds  Hematology negative hematology ROS (+)   Anesthesia Other Findings RIGHT BREAST CSL  Reproductive/Obstetrics hcg negative                              Anesthesia Physical Anesthesia Plan  ASA: 3  Anesthesia Plan: MAC   Post-op Pain Management:    Induction: Intravenous  PONV Risk Score and Plan: 3 and Ondansetron , Propofol  infusion, TIVA and Treatment may vary due to age or medical condition  Airway Management Planned:   Additional Equipment:   Intra-op Plan:   Post-operative Plan:   Informed Consent: I have reviewed the patients History and Physical, chart, labs and discussed the procedure including the risks, benefits and alternatives for the proposed anesthesia with the patient or authorized representative who has indicated his/her understanding and acceptance.     Dental advisory given  Plan Discussed with: CRNA  Anesthesia Plan Comments:          Anesthesia Quick Evaluation

## 2023-12-17 NOTE — Progress Notes (Signed)
 Pt called for pre procedure instructions. Arrival time 0730 NPO after midnight explained Instructed to take am meds with sip of water.   Instructed pt need for ride home tomorrow and have responsible adult with them for 24 hrs post procedure.

## 2023-12-17 NOTE — Telephone Encounter (Signed)
 Cardiac Catheterization scheduled at [facility] for: Tuesday December 18, 2023 10:30 AM/TEE 8:30 AM Arrival time [facility] Main Entrance A at: 7:30 AM  Nothing to eat or drink after midnight prior to procedures, sips of water for medications.  Medication instructions: -Usual morning medications can be taken with sips of water including aspirin  81 mg.  Plan to go home the same day, you will only stay overnight if medically necessary.  You must have responsible adult to drive you home.  Someone must be with you the first 24 hours after you arrive home.  Reviewed procedure instructions with patient.

## 2023-12-18 ENCOUNTER — Ambulatory Visit (HOSPITAL_COMMUNITY)

## 2023-12-18 ENCOUNTER — Ambulatory Visit (HOSPITAL_COMMUNITY)
Admission: RE | Admit: 2023-12-18 | Discharge: 2023-12-18 | Disposition: A | Discharge status: Home / Self Care | Source: Ambulatory Visit | Attending: Internal Medicine | Admitting: Internal Medicine

## 2023-12-18 ENCOUNTER — Ambulatory Visit (HOSPITAL_COMMUNITY): Payer: Self-pay | Admitting: Anesthesiology

## 2023-12-18 ENCOUNTER — Encounter (HOSPITAL_COMMUNITY)
Admission: RE | Disposition: A | Discharge status: Home / Self Care | Payer: Self-pay | Source: Ambulatory Visit | Attending: Internal Medicine

## 2023-12-18 ENCOUNTER — Encounter (HOSPITAL_COMMUNITY): Payer: Self-pay | Admitting: Internal Medicine

## 2023-12-18 ENCOUNTER — Other Ambulatory Visit: Payer: Self-pay

## 2023-12-18 DIAGNOSIS — R0609 Other forms of dyspnea: Secondary | ICD-10-CM | POA: Diagnosis not present

## 2023-12-18 DIAGNOSIS — I341 Nonrheumatic mitral (valve) prolapse: Secondary | ICD-10-CM | POA: Insufficient documentation

## 2023-12-18 DIAGNOSIS — I340 Nonrheumatic mitral (valve) insufficiency: Secondary | ICD-10-CM

## 2023-12-18 DIAGNOSIS — G4733 Obstructive sleep apnea (adult) (pediatric): Secondary | ICD-10-CM | POA: Insufficient documentation

## 2023-12-18 DIAGNOSIS — F418 Other specified anxiety disorders: Secondary | ICD-10-CM | POA: Diagnosis not present

## 2023-12-18 HISTORY — PX: TRANSESOPHAGEAL ECHOCARDIOGRAM (CATH LAB): EP1270

## 2023-12-18 HISTORY — PX: RIGHT/LEFT HEART CATH AND CORONARY ANGIOGRAPHY: CATH118266

## 2023-12-18 LAB — POCT I-STAT EG7
Acid-base deficit: 3 mmol/L — ABNORMAL HIGH (ref 0.0–2.0)
Acid-base deficit: 4 mmol/L — ABNORMAL HIGH (ref 0.0–2.0)
Bicarbonate: 21.9 mmol/L (ref 20.0–28.0)
Bicarbonate: 22.7 mmol/L (ref 20.0–28.0)
Calcium, Ion: 1.18 mmol/L (ref 1.15–1.40)
Calcium, Ion: 1.24 mmol/L (ref 1.15–1.40)
HCT: 34 % — ABNORMAL LOW (ref 36.0–46.0)
HCT: 35 % — ABNORMAL LOW (ref 36.0–46.0)
Hemoglobin: 11.6 g/dL — ABNORMAL LOW (ref 12.0–15.0)
Hemoglobin: 11.9 g/dL — ABNORMAL LOW (ref 12.0–15.0)
O2 Saturation: 72 %
O2 Saturation: 76 %
Potassium: 3.7 mmol/L (ref 3.5–5.1)
Potassium: 3.8 mmol/L (ref 3.5–5.1)
Sodium: 140 mmol/L (ref 135–145)
Sodium: 141 mmol/L (ref 135–145)
TCO2: 23 mmol/L (ref 22–32)
TCO2: 24 mmol/L (ref 22–32)
pCO2, Ven: 42 mmHg — ABNORMAL LOW (ref 44–60)
pCO2, Ven: 43.3 mmHg — ABNORMAL LOW (ref 44–60)
pH, Ven: 7.324 (ref 7.25–7.43)
pH, Ven: 7.326 (ref 7.25–7.43)
pO2, Ven: 41 mmHg (ref 32–45)
pO2, Ven: 44 mmHg (ref 32–45)

## 2023-12-18 LAB — POCT I-STAT 7, (LYTES, BLD GAS, ICA,H+H)
Acid-base deficit: 4 mmol/L — ABNORMAL HIGH (ref 0.0–2.0)
Bicarbonate: 20.1 mmol/L (ref 20.0–28.0)
Calcium, Ion: 1.18 mmol/L (ref 1.15–1.40)
HCT: 34 % — ABNORMAL LOW (ref 36.0–46.0)
Hemoglobin: 11.6 g/dL — ABNORMAL LOW (ref 12.0–15.0)
O2 Saturation: 97 %
Potassium: 3.8 mmol/L (ref 3.5–5.1)
Sodium: 142 mmol/L (ref 135–145)
TCO2: 21 mmol/L — ABNORMAL LOW (ref 22–32)
pCO2 arterial: 33 mmHg (ref 32–48)
pH, Arterial: 7.393 (ref 7.35–7.45)
pO2, Arterial: 87 mmHg (ref 83–108)

## 2023-12-18 LAB — POCT ACTIVATED CLOTTING TIME: Activated Clotting Time: 0 s

## 2023-12-18 LAB — POCT PREGNANCY, URINE: Preg Test, Ur: NEGATIVE

## 2023-12-18 SURGERY — TRANSESOPHAGEAL ECHOCARDIOGRAM (TEE) (CATHLAB)
Anesthesia: Monitor Anesthesia Care

## 2023-12-18 SURGERY — RIGHT/LEFT HEART CATH AND CORONARY ANGIOGRAPHY
Anesthesia: LOCAL

## 2023-12-18 MED ORDER — HEPARIN SODIUM (PORCINE) 1000 UNIT/ML IJ SOLN
INTRAMUSCULAR | Status: AC
  Filled 2023-12-18: qty 10

## 2023-12-18 MED ORDER — SODIUM CHLORIDE 0.9% FLUSH: 3.0000 mL | INTRAVENOUS | Status: DC | PRN

## 2023-12-18 MED ORDER — IOHEXOL 350 MG/ML SOLN
INTRAVENOUS | Status: DC | PRN
  Administered 2023-12-18: 120 mL

## 2023-12-18 MED ORDER — HYDRALAZINE HCL 20 MG/ML IJ SOLN: 10.0000 mg | INTRAMUSCULAR | Status: DC | PRN

## 2023-12-18 MED ORDER — MIDAZOLAM HCL 2 MG/2ML IJ SOLN
INTRAMUSCULAR | Status: AC
Start: 2023-12-18 — End: 2023-12-18
  Filled 2023-12-18: qty 2

## 2023-12-18 MED ORDER — HEPARIN SODIUM (PORCINE) 1000 UNIT/ML IJ SOLN
INTRAMUSCULAR | Status: DC | PRN
  Administered 2023-12-18: 4000 [IU] via INTRAVENOUS

## 2023-12-18 MED ORDER — SODIUM CHLORIDE 0.9 % WEIGHT BASED INFUSION: 1.0000 mL/kg/h | INTRAVENOUS | Status: DC

## 2023-12-18 MED ORDER — HEPARIN (PORCINE) IN NACL 1000-0.9 UT/500ML-% IV SOLN
INTRAVENOUS | Status: DC | PRN
  Administered 2023-12-18 (×2): 500 mL

## 2023-12-18 MED ORDER — MIDAZOLAM HCL 2 MG/2ML IJ SOLN
INTRAMUSCULAR | Status: DC | PRN
  Administered 2023-12-18: 1 mg via INTRAVENOUS

## 2023-12-18 MED ORDER — LABETALOL HCL 5 MG/ML IV SOLN: 10.0000 mg | INTRAVENOUS | Status: DC | PRN

## 2023-12-18 MED ORDER — ONDANSETRON HCL 4 MG/2ML IJ SOLN: 4.0000 mg | Freq: Four times a day (QID) | INTRAMUSCULAR | Status: DC | PRN

## 2023-12-18 MED ORDER — VERAPAMIL HCL 2.5 MG/ML IV SOLN
INTRAVENOUS | Status: DC | PRN
  Administered 2023-12-18: 10 mL via INTRA_ARTERIAL

## 2023-12-18 MED ORDER — LIDOCAINE HCL (PF) 1 % IJ SOLN
INTRAMUSCULAR | Status: DC | PRN
  Administered 2023-12-18 (×2): 2 mL via INTRADERMAL

## 2023-12-18 MED ORDER — SODIUM CHLORIDE 0.9 % IV SOLN: 250.0000 mL | INTRAVENOUS | Status: DC | PRN

## 2023-12-18 MED ORDER — LIDOCAINE 2% (20 MG/ML) 5 ML SYRINGE
INTRAMUSCULAR | Status: DC | PRN
  Administered 2023-12-18: 100 mg via INTRAVENOUS

## 2023-12-18 MED ORDER — SODIUM CHLORIDE 0.9 % IV SOLN: INTRAVENOUS | Status: DC

## 2023-12-18 MED ORDER — FENTANYL CITRATE (PF) 100 MCG/2ML IJ SOLN
INTRAMUSCULAR | Status: AC
  Filled 2023-12-18: qty 2

## 2023-12-18 MED ORDER — SODIUM CHLORIDE 0.9 % WEIGHT BASED INFUSION: 3.0000 mL/kg/h | INTRAVENOUS | Status: AC

## 2023-12-18 MED ORDER — ACETAMINOPHEN 325 MG PO TABS: 650.0000 mg | ORAL_TABLET | ORAL | Status: DC | PRN

## 2023-12-18 MED ORDER — PROPOFOL 10 MG/ML IV BOLUS
INTRAVENOUS | Status: DC | PRN
  Administered 2023-12-18 (×2): 30 mg via INTRAVENOUS
  Administered 2023-12-18: 70 mg via INTRAVENOUS
  Administered 2023-12-18: 30 mg via INTRAVENOUS
  Administered 2023-12-18: 100 ug/kg/min via INTRAVENOUS

## 2023-12-18 MED ORDER — SODIUM CHLORIDE 0.9% FLUSH: 3.0000 mL | Freq: Two times a day (BID) | INTRAVENOUS | Status: DC

## 2023-12-18 MED ORDER — FENTANYL CITRATE (PF) 100 MCG/2ML IJ SOLN
INTRAMUSCULAR | Status: DC | PRN
  Administered 2023-12-18: 25 ug via INTRAVENOUS

## 2023-12-18 MED ORDER — LIDOCAINE HCL (PF) 1 % IJ SOLN
INTRAMUSCULAR | Status: AC
  Filled 2023-12-18: qty 30

## 2023-12-18 SURGICAL SUPPLY — 8 items
CATH 5FR JL3.5 JR4 ANG PIG MP (CATHETERS) IMPLANT
CATH BALLN WEDGE 5F 110CM (CATHETERS) IMPLANT
DEVICE RAD TR BAND REGULAR (VASCULAR PRODUCTS) IMPLANT
GLIDESHEATH SLEND SS 6F .021 (SHEATH) IMPLANT
GUIDEWIRE INQWIRE 1.5J.035X260 (WIRE) IMPLANT
PACK CARDIAC CATHETERIZATION (CUSTOM PROCEDURE TRAY) ×2 IMPLANT
SET ATX-X65L (MISCELLANEOUS) IMPLANT
SHEATH GLIDE SLENDER 4/5FR (SHEATH) IMPLANT

## 2023-12-18 NOTE — Transfer of Care (Signed)
 Immediate Anesthesia Transfer of Care Note  Patient: Marissa Lewis  Procedure(s) Performed: TRANSESOPHAGEAL ECHOCARDIOGRAM  Patient Location: PACU  Anesthesia Type:MAC  Level of Consciousness: awake, alert , and oriented  Airway & Oxygen  Therapy: Patient Spontanous Breathing  Post-op Assessment: Report given to RN and Post -op Vital signs reviewed and stable  Post vital signs: Reviewed and stable  Last Vitals:  Vitals Value Taken Time  BP 104/73 0853  Temp    Pulse 70 0853  Resp 12 0853  SpO2 96 0853    Last Pain:  Vitals:   12/18/23 0735  TempSrc: Temporal  PainSc: 0-No pain         Complications: There were no known notable events for this encounter.

## 2023-12-18 NOTE — Anesthesia Postprocedure Evaluation (Signed)
 Anesthesia Post Note  Patient: Marissa Lewis  Procedure(s) Performed: TRANSESOPHAGEAL ECHOCARDIOGRAM     Patient location during evaluation: Cath Lab Anesthesia Type: MAC Level of consciousness: awake and alert Pain management: pain level controlled Vital Signs Assessment: post-procedure vital signs reviewed and stable Respiratory status: spontaneous breathing Cardiovascular status: stable Anesthetic complications: no   There were no known notable events for this encounter.  Last Vitals:  Vitals:   12/18/23 0855 12/18/23 0930  BP: 104/73 109/75  Pulse: 80 (!) 51  Resp: (!) 24 15  Temp: 36.9 C   SpO2: 93% 100%    Last Pain:  Vitals:   12/18/23 0855  TempSrc: Temporal  PainSc: 0-No pain                 Norleen Pope

## 2023-12-18 NOTE — CV Procedure (Signed)
 INDICATIONS: Mitral valve prolapse  PROCEDURE:   Informed consent was obtained prior to the procedure. The risks, benefits and alternatives for the procedure were discussed and the patient comprehended these risks.  Risks include, but are not limited to, cough, sore throat, vomiting, nausea, somnolence, esophageal and stomach trauma or perforation, bleeding, low blood pressure, aspiration, pneumonia, infection, trauma to the teeth and death.    After a procedural time-out, the oropharynx was anesthetized with 20% benzocaine  spray.   During this procedure the patient was administered propofol  per anesthesia.  The patient's heart rate, blood pressure, and oxygen  saturation were monitored continuously during the procedure. The period of conscious sedation was 30 minutes, of which I was present face-to-face 100% of this time.  The transesophageal probe was inserted in the esophagus and stomach without difficulty and multiple views were obtained.  The patient was kept under observation until the patient left the procedure room.  The patient left the procedure room in stable condition.   Agitated microbubble saline contrast was administered.  COMPLICATIONS:    There were no immediate complications.  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 MR by quantitation of main jet is mod severe however with additional commissural jets, MR is likely severe. Blunting of S1 on Pvein doppler without systolic reversals. No flail scallops noted.  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 No definite PFO.    RECOMMENDATIONS:     Can proceed to cath when ready.   Time Spent Directly with the Patient:  45 minutes   Teryn Boerema A Georgianna Band 12/18/2023, 8:59 AM

## 2023-12-18 NOTE — Interval H&P Note (Signed)
 History and Physical Interval Note:  12/18/2023 8:15 AM  Marissa Lewis  has presented today for surgery, with the diagnosis of MITRAL PROLASPE, MITRAL VALVE REGURGITATION.  The various methods of treatment have been discussed with the patient and family. After consideration of risks, benefits and other options for treatment, the patient has consented to  Procedure(s): TRANSESOPHAGEAL ECHOCARDIOGRAM (N/A) as a surgical intervention.  The patient's history has been reviewed, patient examined, no change in status, stable for surgery.  I have reviewed the patient's chart and labs.  Questions were answered to the patient's satisfaction.     Lety Cullens A Lajune Perine

## 2023-12-18 NOTE — Interval H&P Note (Signed)
 History and Physical Interval Note:  12/18/2023 11:45 AM  Marissa Lewis  has presented today for surgery, with the diagnosis of mr - pre op assessment.  The various methods of treatment have been discussed with the patient and family. After consideration of risks, benefits and other options for treatment, the patient has consented to  Procedure(s): RIGHT/LEFT HEART CATH AND CORONARY ANGIOGRAPHY (N/A) as a surgical intervention.  The patient's history has been reviewed, patient examined, no change in status, stable for surgery.  I have reviewed the patient's chart and labs.  Questions were answered to the patient's satisfaction.     Alm Clay

## 2023-12-19 ENCOUNTER — Ambulatory Visit: Admitting: Cardiology

## 2023-12-22 LAB — ECHO TEE
AR max vel: 2.47 cm2
AV Area VTI: 2.7 cm2
AV Area mean vel: 2.28 cm2
AV Mean grad: 4 mmHg
AV Peak grad: 6.7 mmHg
Ao pk vel: 1.29 m/s
MV VTI: 2.78 cm2

## 2024-01-07 ENCOUNTER — Encounter: Payer: Self-pay | Admitting: Cardiology

## 2024-01-07 ENCOUNTER — Ambulatory Visit: Attending: Cardiology | Admitting: Cardiology

## 2024-01-07 VITALS — BP 120/82 | HR 62 | Resp 16 | Ht 69.0 in | Wt 173.4 lb

## 2024-01-07 DIAGNOSIS — I341 Nonrheumatic mitral (valve) prolapse: Secondary | ICD-10-CM | POA: Diagnosis not present

## 2024-01-07 DIAGNOSIS — I059 Rheumatic mitral valve disease, unspecified: Secondary | ICD-10-CM

## 2024-01-07 NOTE — Progress Notes (Signed)
 Cardiology Office Note:  .   Date:  01/07/2024  ID:  Marissa Lewis, DOB November 26, 1970, MRN [id number] PCP:  Perri Ronal JINNY, MD  Former Cardiology Providers: None [facility] Providers Cardiologist:  Marissa Large, DO , [facility] (established care 09/18/2019) Electrophysiologist:  None  Click to update primary MD,subspecialty MD or APP then REFRESH:1}    Chief Complaint  Patient presents with   Mitral Regurgitation   Follow-up    History of Present Illness: .   Marissa Lewis is a 53 y.o. 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  Patient reevaluated prior to his annual basis given her myxomatous mitral valve associated mitral regurgitation.  She was last seen in the office by myself in June 2024.  In the interim, PCP reached out if she is becoming more short of breath with day-to-day activities and should be reevaluated.  She underwent transthoracic echocardiogram which noted concerns for progression of mitral regurgitation and given the progression of symptoms that she was seen sooner by my partner Dr. Anner.  Patient was set up for a transesophageal echocardiogram to further evaluate the mitral apparatus and severity of MR as well as left and right heart catheterization.  She presents today for follow-up post mitral valve repair/replacement workup.  Patient states that she continues to have shortness of breath when she lays flat, often wakes up in the night due to coughing episodes, continues to use 1 pillow when going to sleep.  No significant change in her ability to do ADLs.  But sporadically gets short of breath more quicker with the same amount of activity a year ago.  Patient states that the frequency of her symptoms are significantly greater now than a year ago.  No change in intensity or duration.  Review of Systems: .   Review of Systems   Cardiovascular:  Positive for dyspnea on exertion and paroxysmal nocturnal dyspnea. Negative for chest pain, claudication, irregular heartbeat, leg swelling, near-syncope, orthopnea, palpitations and syncope.  Respiratory:  Positive for shortness of breath.   Hematologic/Lymphatic: Negative for bleeding problem.    Studies Reviewed:   EKG: EKG Interpretation Date/Time:  Monday January 07 2024 08:20:33 EDT Ventricular Rate:  64 PR Interval:  176 QRS Duration:  84 QT Interval:  432 QTC Calculation: 445 R Axis:   95  Text Interpretation: Normal sinus rhythm Low voltage QRS Possible Anterolateral infarct (cited on or before 04-Apr-2021) When compared with ECG of 28-Nov-2023 09:28, No significant change since last tracing Confirmed by Lewis Marissa ([id number]) on 01/07/2024 8:25:45 AM  Echocardiogram: June 2025  1. Left ventricular ejection fraction, by estimation, is 55 to 60%. Left ventricular ejection fraction by 3D volume is 58 %. The left ventricle has normal function. The left ventricle has no regional wall motion  abnormalities. Left ventricular diastolic  parameters are indeterminate.   2. Right ventricular systolic function is normal. The right ventricular size is normal.   3. Horizontal color splay with blunting of the right sided pulmonary vein signal Mitral regurgitation is at least moderate and appears worse in clip 69. The mitral valve is myxomatous. Moderate to severe mitral valve regurgitation. No evidence of mitral  stenosis.   4. The aortic valve was not well visualized. Aortic valve regurgitation is not visualized. No aortic stenosis is present.   TEE 12/22/2023  1. Left ventricular ejection fraction, by estimation, is 60 to 65%. The left ventricle has normal function.  2. Right ventricular systolic function is normal. The right ventricular size is normal.   3. Left atrial size was mildly dilated. No left atrial/left atrial appendage thrombus was detected. The LAA emptying  velocity was 148 cm/s.   4. Bileaflet mitral valve prolapse with mild mitral annular disjunction. No flail segments. Mid-late systolic mitral valve regurgitation, systolic blunting with no systolic reversals in pulmonary veins. The mitral valve  is myxomatous. Moderate to severe mitral valve regurgitation. No evidence of mitral stenosis.   5. The tricuspid valve is mildly myxomatous with trivial TR.   6. The aortic valve is tricuspid. Aortic valve regurgitation is not visualized. No aortic stenosis is present.   7. Agitated saline contrast bubble study was positive with shunting observed after >6 cardiac cycles suggestive of intrapulmonary shunting, small.   8. 3D performed of the mitral valve and demonstrates myxomatous mitral valve with bileaflet prolapse and moderate-severe MR.   Left & Right Heart cath and coronary angiography 12/18/2023 Dominance: Left   Angiographically Normal Coronary Arteries with a Left Dominant System Moderate to severe MR by TEE Normal Right Heart Cath Numbers- mean PAP 22 mm, with PCWP of 19 mm. LVEDP of 15 mmHg. Fick Cardiac Output-Index 6.27-3.22.    RECOMMENDATIONS   In the absence of any other complications or medical issues, we expect the patient to be ready for discharge from a cath perspective on 12/18/2023.   Will plan referral to [facility]TS for evaluation for MVR  RADIOLOGY: NA  Risk Assessment/Calculations:   NA   Labs:       Latest Ref Rng & Units 12/18/2023   12:37 PM 12/18/2023   12:32 PM 12/18/2023   12:26 PM  CBC  Hemoglobin 12.0 - 15.0 g/dL 88.0  88.3  88.3   Hematocrit 36.0 - 46.0 % 35.0  34.0  34.0        Latest Ref Rng & Units 12/18/2023   12:37 PM 12/18/2023   12:32 PM 12/18/2023   12:26 PM  BMP  Sodium 135 - 145 mmol/L 140  141  142   Potassium 3.5 - 5.1 mmol/L 3.8  3.7  3.8       Latest Ref Rng & Units 12/18/2023   12:37 PM 12/18/2023   12:32 PM 12/18/2023   12:26 PM  CMP  Sodium 135 - 145 mmol/L 140  141  142    Potassium 3.5 - 5.1 mmol/L 3.8  3.7  3.8     Lab Results  Component Value Date   CHOL 198 11/23/2023   HDL 78 11/23/2023   LDLCALC 104 (H) 11/23/2023   TRIG 71 11/23/2023   CHOLHDL 2.5 11/23/2023   No results for input(s): LIPOA in the last 8760 hours. No components found for: NTPROBNP No results for input(s): PROBNP in the last 8760 hours. Recent Labs    03/19/23 0936 11/23/23 0912  TSH 0.88 0.92    Physical Exam:    Today's Vitals   01/07/24 0817  BP: 120/82  Pulse: 62  Resp: 16  SpO2: 96%  Weight: 173 lb 6.4 oz (78.7 kg)  Height: 5' 9 (1.753 m)   Body mass index is 25.61 kg/m. Wt Readings from Last 3 Encounters:  01/07/24 173 lb 6.4 oz (78.7 kg)  11/28/23 175 lb (79.4 kg)  11/22/23 175 lb 12.8 oz (79.7 kg)    Physical Exam  Constitutional: No distress.  Age appropriate, hemodynamically stable.   Neck: No JVD present.  Cardiovascular: Normal rate, regular rhythm, S1 normal, S2  normal, intact distal pulses and normal pulses. Exam reveals no gallop, no S3 and no S4.  Murmur heard. Holosystolic murmur is present with a grade of 3/6 at the apex. Pulmonary/Chest: Effort normal and breath sounds normal. No stridor. She has no wheezes. She has no rales.  Abdominal: Soft. Bowel sounds are normal. She exhibits no distension. There is no abdominal tenderness.  Musculoskeletal:        General: No edema.     Cervical back: Neck supple.  Neurological: She is alert and oriented to person, place, and time. She has intact cranial nerves (2-12).  Skin: Skin is warm and moist.     Impression & Recommendation(s):  Impression:   ICD-10-CM   1. Mitral valve disease  I05.9 EKG 12-Lead    Ambulatory referral to Cardiothoracic Surgery    2. MVP (mitral valve prolapse)  I34.1 Ambulatory referral to Cardiothoracic Surgery       Recommendation(s):  Mitral valve disease MVP (mitral valve prolapse) Known history of bilateral mitral valve prolapse/myxomatous mitral  valve with mitral regurgitation. Has been followed clinically for the last several years. Now her symptoms of shortness of breath and PND are more frequent and noticeable when compared to past. She underwent a transesophageal echocardiogram-results reviewed with her in detail at today's visit. She also underwent left and right heart catheterization and was noted to have normal coronary arteries. Given her young age and no significant past medical history we will refer her to Dr. Ricky at [facility] to evaluate for candidacy for possible mini mitral valve repair/replacement. I have asked the patient to ensure that they have the images of TEE and angiography prior to her consultation with cardiothoracic surgery.  Will have our office work on getting this transferred over with regards to continuity of care.   Orders Placed:  Orders Placed This Encounter  Procedures   Ambulatory referral to Cardiothoracic Surgery    Referral Priority:   Routine    Referral Type:   Surgical    Referral Reason:   Specialty Services Required    Referred to Provider:   Ricky Reyes MATSU, MD    Requested Specialty:   Cardiothoracic Surgery    Number of Visits Requested:   1   EKG 12-Lead     Final Medication List:   No orders of the defined types were placed in this encounter.   There are no discontinued medications.   Current Outpatient Medications:    ascorbic acid (VITAMIN C) 500 MG tablet, Take 500 mg by mouth daily., Disp: , Rfl:    BIOTIN PO, Take 1 tablet by mouth daily., Disp: , Rfl:    Cholecalciferol (VITAMIN D3) 125 MCG (5000 UT) CAPS, Take 5,000 Units by mouth daily., Disp: , Rfl:    ferrous sulfate 325 (65 FE) MG tablet, Take 325 mg by mouth daily with breakfast., Disp: , Rfl:    ibuprofen (ADVIL) 200 MG tablet, Take 400 mg by mouth every 6 (six) hours as needed for moderate pain., Disp: , Rfl:    Probiotic Product (PROBIOTIC DAILY PO), Take 1 capsule by mouth daily., Disp: , Rfl:    Consent:   NA  Disposition:   3 months sooner if needed  Her questions and concerns were addressed to her satisfaction. She voices understanding of the recommendations provided during this encounter.    Signed, Marissa Large, DO, [facility]  A Division of [facility] [address] [place], [place] [address]  [place],  [place] [address] 01/07/2024 10:11 AM

## 2024-01-07 NOTE — Patient Instructions (Signed)
 Medication Instructions:  No medication changes were made at this visit. Continue current regimen.   *If you need a refill on your cardiac medications before your next appointment, please call your pharmacy*  Lab Work: None ordered today. If you have labs (blood work) drawn today and your tests are completely normal, you will receive your results only by: MyChart Message (if you have MyChart) OR A paper copy in the mail If you have any lab test that is abnormal or we need to change your treatment, we will call you to review the results.  Testing/Procedures: None ordered today.  Follow-Up: At [facility], you and your health needs are our priority.  As part of our continuing mission to provide you with exceptional heart care, our providers are all part of one team.  This team includes your primary Cardiologist (physician) and Advanced Practice Providers or APPs (Physician Assistants and Nurse Practitioners) who all work together to provide you with the care you need, when you need it.  Your next appointment:   3 month(s)  Provider:   Madonna Large, DO    We recommend signing up for the patient portal called MyChart.  Sign up information is provided on this After Visit Summary.  MyChart is used to connect with patients for Virtual Visits (Telemedicine).  Patients are able to view lab/test results, encounter notes, upcoming appointments, etc.  Non-urgent messages can be sent to your provider as well.   To learn more about what you can do with MyChart, go to [email or web address].   Other Instructions Our office is placing a referral to Dr. Reyes Fruits to discuss your mitral valve disease. Someone from their office will be in contact with you.

## 2024-01-22 ENCOUNTER — Telehealth: Payer: Self-pay | Admitting: Cardiology

## 2024-01-22 NOTE — Telephone Encounter (Signed)
 Called pt advised referral was placed on 01/07/24 to Dr. Reyes Fruits with [facility].  Provided pt with MD office information to contact to set up appointment.  Will send a message to referral team to review.

## 2024-01-22 NOTE — Telephone Encounter (Signed)
 Pt would like a c/b regarding referral to [facility] CVTS for evaluation for MVR. Please advise.

## 2024-02-11 DIAGNOSIS — Z9882 Breast implant status: Secondary | ICD-10-CM | POA: Diagnosis not present

## 2024-02-11 DIAGNOSIS — I340 Nonrheumatic mitral (valve) insufficiency: Secondary | ICD-10-CM | POA: Diagnosis not present

## 2024-03-14 ENCOUNTER — Encounter: Payer: Self-pay | Admitting: Internal Medicine

## 2024-03-17 DIAGNOSIS — Z4682 Encounter for fitting and adjustment of non-vascular catheter: Secondary | ICD-10-CM | POA: Diagnosis not present

## 2024-03-17 DIAGNOSIS — J9811 Atelectasis: Secondary | ICD-10-CM | POA: Diagnosis not present

## 2024-03-17 DIAGNOSIS — Z79899 Other long term (current) drug therapy: Secondary | ICD-10-CM | POA: Diagnosis not present

## 2024-03-17 DIAGNOSIS — D688 Other specified coagulation defects: Secondary | ICD-10-CM | POA: Diagnosis not present

## 2024-03-17 DIAGNOSIS — R578 Other shock: Secondary | ICD-10-CM | POA: Diagnosis not present

## 2024-03-17 DIAGNOSIS — Y849 Medical procedure, unspecified as the cause of abnormal reaction of the patient, or of later complication, without mention of misadventure at the time of the procedure: Secondary | ICD-10-CM | POA: Diagnosis not present

## 2024-03-17 DIAGNOSIS — R0689 Other abnormalities of breathing: Secondary | ICD-10-CM | POA: Diagnosis not present

## 2024-03-17 DIAGNOSIS — I340 Nonrheumatic mitral (valve) insufficiency: Secondary | ICD-10-CM | POA: Diagnosis not present

## 2024-03-17 DIAGNOSIS — Z7982 Long term (current) use of aspirin: Secondary | ICD-10-CM | POA: Diagnosis not present

## 2024-03-17 DIAGNOSIS — D72829 Elevated white blood cell count, unspecified: Secondary | ICD-10-CM | POA: Diagnosis not present

## 2024-03-17 DIAGNOSIS — Z952 Presence of prosthetic heart valve: Secondary | ICD-10-CM | POA: Diagnosis not present

## 2024-03-17 DIAGNOSIS — I5032 Chronic diastolic (congestive) heart failure: Secondary | ICD-10-CM | POA: Diagnosis not present

## 2024-03-17 DIAGNOSIS — E1122 Type 2 diabetes mellitus with diabetic chronic kidney disease: Secondary | ICD-10-CM | POA: Diagnosis not present

## 2024-03-17 DIAGNOSIS — I97621 Postprocedural hematoma of a circulatory system organ or structure following other procedure: Secondary | ICD-10-CM | POA: Diagnosis not present

## 2024-03-17 DIAGNOSIS — R918 Other nonspecific abnormal finding of lung field: Secondary | ICD-10-CM | POA: Diagnosis not present

## 2024-03-17 DIAGNOSIS — N179 Acute kidney failure, unspecified: Secondary | ICD-10-CM | POA: Diagnosis not present

## 2024-03-17 DIAGNOSIS — J9601 Acute respiratory failure with hypoxia: Secondary | ICD-10-CM | POA: Diagnosis not present

## 2024-03-17 DIAGNOSIS — J942 Hemothorax: Secondary | ICD-10-CM | POA: Diagnosis not present

## 2024-03-17 DIAGNOSIS — G8918 Other acute postprocedural pain: Secondary | ICD-10-CM | POA: Diagnosis not present

## 2024-03-17 DIAGNOSIS — I97638 Postprocedural hematoma of a circulatory system organ or structure following other circulatory system procedure: Secondary | ICD-10-CM | POA: Diagnosis not present

## 2024-03-17 DIAGNOSIS — Z452 Encounter for adjustment and management of vascular access device: Secondary | ICD-10-CM | POA: Diagnosis not present

## 2024-03-17 DIAGNOSIS — F419 Anxiety disorder, unspecified: Secondary | ICD-10-CM | POA: Diagnosis not present

## 2024-03-17 DIAGNOSIS — D62 Acute posthemorrhagic anemia: Secondary | ICD-10-CM | POA: Diagnosis not present

## 2024-03-17 DIAGNOSIS — J90 Pleural effusion, not elsewhere classified: Secondary | ICD-10-CM | POA: Diagnosis not present

## 2024-03-17 DIAGNOSIS — J811 Chronic pulmonary edema: Secondary | ICD-10-CM | POA: Diagnosis not present

## 2024-03-17 DIAGNOSIS — J948 Other specified pleural conditions: Secondary | ICD-10-CM | POA: Diagnosis not present

## 2024-03-17 DIAGNOSIS — J9602 Acute respiratory failure with hypercapnia: Secondary | ICD-10-CM | POA: Diagnosis not present

## 2024-03-17 DIAGNOSIS — I341 Nonrheumatic mitral (valve) prolapse: Secondary | ICD-10-CM | POA: Diagnosis not present

## 2024-03-25 ENCOUNTER — Telehealth: Payer: Self-pay | Admitting: Internal Medicine

## 2024-03-25 NOTE — Telephone Encounter (Signed)
 Copied from CRM [id number]. Topic: Clinical - Medication Question >> Mar 25, 2024  9:13 AM Rosaria BRAVO wrote: Reason for CRM: pt called requesting medications that she received in the hospital at [facility]. She just had heart surgery and was given vistaril and Pepcid . They discharged her without these meds. She was told to contact her pcp for these meds.   [id number] [address] [place], [place] - [address] [place] [address] Phone: [phone] Fax: [phone]  Requesting a call back from the clinic.

## 2024-03-27 ENCOUNTER — Telehealth (HOSPITAL_COMMUNITY): Payer: Self-pay

## 2024-03-27 NOTE — Telephone Encounter (Signed)
 Outside/paper referral received by Dr. Gaca from [facility]. Will fax over request for EKG. Insurance benefits and eligibility to be determined.   Patient confirmed interest in cardiac rehab. Patient states she has f/u with Dr. Ricky on 11/14 and a f/u with Dr. Michele on 11/18.

## 2024-03-28 ENCOUNTER — Telehealth (HOSPITAL_COMMUNITY): Payer: Self-pay

## 2024-03-28 NOTE — Telephone Encounter (Signed)
 Pt insurance is active and benefits verified through [employer]. Co-pay $0, DED $4,000/$4,000 met, out of pocket $8,000/$8,000 met, co-insurance 40%. No pre-authorization required. 03/28/2024 @ 10:01am, spoke with Fronie, REF# [id number].  TCR/ICR? ICR Visit(date of service)limitation? No Can multiple codes be used on the same date of service/visit?(IF ITS A LIMIT) N/A  Is this a lifetime maximum or an annual maximum? Annual Has the member used any of these services to date? No Is there a time limit (weeks/months) on start of program and/or program completion? No

## 2024-04-08 NOTE — Progress Notes (Signed)
 Patient Care Team: Perri Ronal PARAS, MD as PCP - General (Internal Medicine) Michele Richardson, DO as PCP - Cardiology (Cardiology)  Visit Date: 04/08/24  Subjective:    Patient ID: Marissa Lewis , Female   DOB: 1970-09-28, 53 y.o.    MRN: [id number]   53 y.o. Female presents today for Hospital follow up post mitral valve replacement. Patient has a past medical history of mitral regurgitation, endometriosis, iron deficiency, Vitamin D  deficiency.  On 03/18/2024 She underwent a mitral valve replacement. She has been followed by cardiology for several years after discovery of a murmur. Most recent echo has noted progression of MR to now severe with bileaflet prolapse. Cath revealed no CAD. She endorses SOB/DOE, fatigue and palpitations. Overall, the she tolerated the procedure well without significant difficulty or evident complication, and was transferred to the cardiothoracic surgical intensive care unit. Plastics consulted intaop for Right breast implant rupture. Will follow up with [facility] in outpatient setting for breast augmentation. Rhythm at the time of discharge was normal sinus rhythm. She was discharged 5 days later. She was prescribed Acetaminophen  325 mg every 6 hours, Asprin 81 mg daily, Furosemide 40 mg twice daily. Methocarbamol 750 mg four times daily. Oxycodone  5 mg every 6 hours, Klor-con 20 meq, Sennosides-Docusate 8.6-50 mg twice daily upon discharge.    Followed by [facility] for moderate mitral regurgitation.  In June 2024, EKG showed sinus bradycardia. She had transesophageal echocardiogram in 2021.  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    History of endometriosis. She is having menses every three weeks and reports abdominal cramps before start of menstrual cycles.   History of iron deficiency treated with ferrous sulfate 325 mg daily.   History of Vitamin D  deficiency treated with Vitamin D2 50,000  units weekly.  Past Medical History:  Diagnosis Date   Anxiety    Depression    Gilbert's syndrome    Mitral regurgitation    UTI (lower urinary tract infection)      Family History  Problem Relation Age of Onset   Diabetes Mother    Hyperlipidemia Mother    Diabetes Father    Lung cancer Father    Cancer Maternal Grandmother        breast   Diabetes Sister    Hyperlipidemia Brother    Family history colon cancer maternal grandmother. Diabetes in father, mother and sister. Hyperlipidemia in brother and mother. Lung cancer in father.   Social history: She has 2 children.   Married.  Never smoked.       ROS Has some fatigue. Has anxiety about how she will do post op. Anxious and wants refill on Hydroxyzine (Vistaril) which she was given at [facility] for anxiety and worked well for her. This was refilled today.      Objective:   Vitals: BP 100/80, pulse 94 , Weight 168 pounds, BMI 24.81, Pulse ox 98%   Physical Exam  Skin is warm and dry.  No carotid bruits.  No thyromegaly.  Chest clear.  Cardiac exam mostly regular rate and rhythm with occasional extrasystole.  No lower extremity edema.  Affect is slightly anxious.  Results:   Studies obtained and personally reviewed by me:  Imaging, colonoscopy, mammogram, bone density scan, echocardiogram, heart cath, stress test, CT calcium score, etc.    Labs:       Component Value Date/Time   NA 140 12/18/2023 1237   NA 142 12/07/2023 0958  K 3.8 12/18/2023 1237   CL 107 (H) 12/07/2023 0958   CO2 19 (L) 12/07/2023 0958   GLUCOSE 85 12/07/2023 0958   GLUCOSE 92 11/23/2023 0912   BUN 9 12/07/2023 0958   CREATININE 0.85 12/07/2023 0958   CREATININE 0.85 11/23/2023 0912   CALCIUM 8.9 12/07/2023 0958   PROT 6.4 11/23/2023 0912   ALBUMIN 3.9 04/04/2021 1054   AST 14 11/23/2023 0912   ALT 10 11/23/2023 0912   ALKPHOS 45 04/04/2021 1054   BILITOT 1.4 (H) 11/23/2023 0912   GFRNONAA >60 04/04/2021 1054   GFRNONAA 59 (L)  03/22/2020 0945   GFRAA 68 03/22/2020 0945     Lab Results  Component Value Date   WBC 5.9 12/07/2023   HGB 11.9 (L) 12/18/2023   HCT 35.0 (L) 12/18/2023   MCV 94 12/07/2023   PLT 269 12/07/2023    Lab Results  Component Value Date   CHOL 198 11/23/2023   HDL 78 11/23/2023   LDLCALC 104 (H) 11/23/2023   TRIG 71 11/23/2023   CHOLHDL 2.5 11/23/2023        Assessment & Plan:   History of myxomatous mitral valve associated with mitral regurgitation followed by Dr. Michele with recent concern for progression of mitral regurgitation.  Patient underwent mitralplasty  with radical reconstruction of mitral valve by Dr. Renella at [facility] on October 20th and has had recent follow-up appointment  at [facility] and felt to be doing well.  Patient is anxious and is concerned she will not make a full recovery.  Has been taking hydroxyzine up to 3 times daily for anxiety.  This medication was refilled.  Patient was advised that she was recovering well at this time and was encouraged to call with questions or concerns.  She also has right breast implant rupture and will be following up with plastic surgery.  Anxiety-treated with hydroxyzine 10 mg up to 3 times daily.  This was refilled  I,Makayla C Reid,acting as a scribe for Ronal JINNY Hailstone, MD.,have documented all relevant documentation on the behalf of Ronal JINNY Hailstone, MD,as directed by  Ronal JINNY Hailstone, MD while in the presence of Ronal JINNY Hailstone, MD.   I, Ronal JINNY Hailstone, MD, have reviewed all documentation for this visit. The documentation on 04/11/2024 for the exam, diagnosis, procedures, and orders are all accurate and complete.

## 2024-04-10 DIAGNOSIS — J986 Disorders of diaphragm: Secondary | ICD-10-CM | POA: Diagnosis not present

## 2024-04-10 DIAGNOSIS — J939 Pneumothorax, unspecified: Secondary | ICD-10-CM | POA: Diagnosis not present

## 2024-04-10 DIAGNOSIS — R9431 Abnormal electrocardiogram [ECG] [EKG]: Secondary | ICD-10-CM | POA: Diagnosis not present

## 2024-04-10 DIAGNOSIS — Z952 Presence of prosthetic heart valve: Secondary | ICD-10-CM | POA: Diagnosis not present

## 2024-04-10 DIAGNOSIS — Z7982 Long term (current) use of aspirin: Secondary | ICD-10-CM | POA: Diagnosis not present

## 2024-04-10 DIAGNOSIS — R918 Other nonspecific abnormal finding of lung field: Secondary | ICD-10-CM | POA: Diagnosis not present

## 2024-04-10 DIAGNOSIS — Z48812 Encounter for surgical aftercare following surgery on the circulatory system: Secondary | ICD-10-CM | POA: Diagnosis not present

## 2024-04-11 ENCOUNTER — Ambulatory Visit: Admitting: Internal Medicine

## 2024-04-11 ENCOUNTER — Encounter: Payer: Self-pay | Admitting: Internal Medicine

## 2024-04-11 VITALS — BP 100/80 | HR 94 | Ht 69.0 in | Wt 168.0 lb

## 2024-04-11 DIAGNOSIS — Z09 Encounter for follow-up examination after completed treatment for conditions other than malignant neoplasm: Secondary | ICD-10-CM

## 2024-04-11 DIAGNOSIS — N946 Dysmenorrhea, unspecified: Secondary | ICD-10-CM

## 2024-04-11 DIAGNOSIS — E559 Vitamin D deficiency, unspecified: Secondary | ICD-10-CM

## 2024-04-11 DIAGNOSIS — Z8742 Personal history of other diseases of the female genital tract: Secondary | ICD-10-CM

## 2024-04-11 DIAGNOSIS — Z8639 Personal history of other endocrine, nutritional and metabolic disease: Secondary | ICD-10-CM

## 2024-04-11 DIAGNOSIS — Z9889 Other specified postprocedural states: Secondary | ICD-10-CM | POA: Diagnosis not present

## 2024-04-11 DIAGNOSIS — F419 Anxiety disorder, unspecified: Secondary | ICD-10-CM | POA: Diagnosis not present

## 2024-04-11 DIAGNOSIS — Z9882 Breast implant status: Secondary | ICD-10-CM

## 2024-04-11 DIAGNOSIS — T8543XD Leakage of breast prosthesis and implant, subsequent encounter: Secondary | ICD-10-CM

## 2024-04-11 MED ORDER — HYDROXYZINE PAMOATE 25 MG PO CAPS: 25.0000 mg | ORAL_CAPSULE | Freq: Three times a day (TID) | ORAL | 1 refills | Status: AC | PRN

## 2024-04-11 NOTE — Patient Instructions (Addendum)
 Doing well postoperatively. Hydoxyzine refilled for anxiety. Continue close Cardiology follow up.Follow up here in 3 months regarding anemia. Continue Vitamin d  supplement.

## 2024-04-11 NOTE — Progress Notes (Addendum)
 Patient Care Team: Perri Ronal PARAS, MD as PCP - General (Internal Medicine) Michele Richardson, DO as PCP - Cardiology (Cardiology)  Visit Date: 04/11/24  Subjective:    Patient ID: Marissa Lewis , Female   DOB: 05-Jul-1970, 53 y.o.    MRN: [id number]   53 y.o. Female presents today for Hospital follow up post mitral valve repair. Patient has a past medical history of Mitral regurgitation, endometriosis, iron deficiency, Vitamin D  deficiency.  On 03/18/2024 She underwent a mitral valve repair. She has been followed by Cardiology for several years after discovery of a murmur. Most recent Echo had noted progression of MR to now severe with bileaflet prolapse. Cath revealed no CAD. She had SOB/DOE, fatigue and palpitations. Overall, the she tolerated the procedure well without significant difficulty or evident complication, and was transferred to the cardiothoracic surgical intensive care unit. Plastic Surgery was consulted for Right breast implant rupture. She is to follow up with Plastic surgery later after recovery from mitral valve repair.   Rhythm at the time of discharge was normal sinus rhythm.  She was prescribed Acetaminophen  325 mg every 6 hours, Asprin 81 mg daily, Furosemide 40 mg twice daily. Methocarbamol 750 mg four times daily. Oxycodone  5 mg every 6 hours, Klor-con 20 meq, Sennosides-Docusate 8.6-50 mg twice daily upon discharge.    Followed locally by [facility]  Cardiology, Dr Michele, for moderate mitral regurgitation.  In June 2024, EKG showed sinus bradycardia. She had transesophageal echocardiogram in 2021.  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     History of anxiety. Her anxiety has worsened due to her recent surgery.    History of endometriosis. She is having menses every three weeks and reports abdominal cramps before start of menstrual cycles.   History of iron deficiency treated with ferrous sulfate 325 mg daily.    History of Vitamin D  deficiency treated with Vitamin D2 50,000 units weekly.  Past Medical History:  Diagnosis Date   Anxiety    Depression    Gilbert's syndrome    Mitral regurgitation    UTI (lower urinary tract infection)      Family History  Problem Relation Age of Onset   Diabetes Mother    Hyperlipidemia Mother    Diabetes Father    Lung cancer Father    Cancer Maternal Grandmother        breast   Diabetes Sister    Hyperlipidemia Brother    Family history colon cancer maternal grandmother. Diabetes in father, mother and sister. Hyperlipidemia in brother and mother. Lung cancer in father.   Social history: She has 2 children and works for the [employer].  Married.  Never smoked.      Review of Systems  All other systems reviewed and are negative.       Objective:   Vitals: BP 100/80   Pulse 94   Ht 5' 9 (1.753 m)   Wt 168 lb (76.2 kg)   SpO2 98%   BMI 24.81 kg/m    Physical Exam Vitals and nursing note reviewed.     BP 100/80, Pulse 94, weight 168 lbs Ht 5 ft 9 in. BMI 24.81,  Results:    Labs:       Component Value Date/Time   NA 140 12/18/2023 1237   NA 142 12/07/2023 0958   K 3.8 12/18/2023 1237   CL 107 (H) 12/07/2023 0958   CO2 19 (L) 12/07/2023 9041  GLUCOSE 85 12/07/2023 0958   GLUCOSE 92 11/23/2023 0912   BUN 9 12/07/2023 0958   CREATININE 0.85 12/07/2023 0958   CREATININE 0.85 11/23/2023 0912   CALCIUM 8.9 12/07/2023 0958   PROT 6.4 11/23/2023 0912   ALBUMIN 3.9 04/04/2021 1054   AST 14 11/23/2023 0912   ALT 10 11/23/2023 0912   ALKPHOS 45 04/04/2021 1054   BILITOT 1.4 (H) 11/23/2023 0912   GFRNONAA >60 04/04/2021 1054   GFRNONAA 59 (L) 03/22/2020 0945   GFRAA 68 03/22/2020 0945     Lab Results  Component Value Date   WBC 5.9 12/07/2023   HGB 11.9 (L) 12/18/2023   HCT 35.0 (L) 12/18/2023   MCV 94 12/07/2023   PLT 269 12/07/2023    Lab Results  Component Value Date   CHOL 198 11/23/2023   HDL 78  11/23/2023   LDLCALC 104 (H) 11/23/2023   TRIG 71 11/23/2023   CHOLHDL 2.5 11/23/2023     Lab Results  Component Value Date   TSH 0.92 11/23/2023        Assessment & Plan:   On 03/18/2024 She underwent a mitral valve repair. She has been followed by cardiology for several years after discovery of a murmur. Most recent echo has noted progression of MR to now severe with bileaflet prolapse. Cath revealed no CAD. She had SOB/DOE, fatigue and palpitations. Overall, the she tolerated the procedure well without significant difficulty or evident complication, and was transferred to the cardiothoracic surgical intensive care unit. Plastic Surgery Staff was consulted for Right breast implant rupture. Will follow up with them in outpatient setting for breast augmentation. Rhythm at the time of discharge was normal sinus rhythm. She was discharged 5 days later. She was prescribed Acetaminophen  325 mg every 6 hours, Asprin 81 mg daily, Furosemide 40 mg twice daily. Methocarbamol 750 mg four times daily. Oxycodone  5 mg every 6 hours, Klor-con 20 meq, Sennosides-Docusate 8.6-50 mg twice daily upon discharge.    Followed by [facility] for moderate mitral regurgitation. June 2024 EKG showed sinus bradycardia. She had transesophageal echocardiogram in 2021.  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     Anxiety: Her anxiety has worsened due to her recent surgery.    Vitstaril 25 mg daily prescribed.    Endometriosis: She is having menses every three weeks and reports abdominal cramps before start of menstrual cycles.   Iron deficiency: treated with ferrous sulfate 325 mg daily.   Vitamin D  deficiency: treated with Vitamin D2 50,000 units weekly.  Plan: continue close cardiology Follow up. Follow up with Pl[facility] surgery at [facility] regarding implant. Continue iron tab daily. Has Vistaril  for anxiety.  I,Makayla C Reid,acting as a  scribe for Ronal JINNY Hailstone, MD.,have documented all relevant documentation on the behalf of Ronal JINNY Hailstone, MD,as directed by  Ronal JINNY Hailstone, MD while in the presence of Ronal JINNY Hailstone, MD.  I, Ronal JINNY Hailstone, MD, have reviewed all documentation for this visit. The documentation on 04/11/2024 for the exam, diagnosis, procedures, and orders are all accurate and complete.

## 2024-04-15 ENCOUNTER — Encounter: Payer: Self-pay | Admitting: Cardiology

## 2024-04-15 ENCOUNTER — Ambulatory Visit: Attending: Cardiology | Admitting: Cardiology

## 2024-04-15 VITALS — BP 120/82 | HR 103 | Resp 16 | Ht 69.0 in | Wt 171.0 lb

## 2024-04-15 DIAGNOSIS — I349 Nonrheumatic mitral valve disorder, unspecified: Secondary | ICD-10-CM | POA: Diagnosis not present

## 2024-04-15 DIAGNOSIS — I059 Rheumatic mitral valve disease, unspecified: Secondary | ICD-10-CM

## 2024-04-15 DIAGNOSIS — Z9889 Other specified postprocedural states: Secondary | ICD-10-CM

## 2024-04-15 NOTE — Patient Instructions (Addendum)
 Medication Instructions:  Your physician recommends that you continue on your current medications as directed. Please refer to the Current Medication list given to you today.   Lab Work: None Ordered If you have labs (blood work) drawn today and your tests are completely normal, you will receive your results only by: MyChart Message (if you have MyChart) OR A paper copy in the mail If you have any lab test that is abnormal or we need to change your treatment, we will call you to review the results.  Testing/Procedures: ECHOCARDIOGRAM  Follow-Up: At [facility], you and your health needs are our priority.  As part of our continuing mission to provide you with exceptional heart care, our providers are all part of one team.  This team includes your primary Cardiologist (physician) and Advanced Practice Providers or APPs (Physician Assistants and Nurse Practitioners) who all work together to provide you with the care you need, when you need it.  Your next appointment:   December 2026, after Echocardiogram  Provider:   Madonna Large, DO    We recommend signing up for the patient portal called MyChart.  Sign up information is provided on this After Visit Summary.  MyChart is used to connect with patients for Virtual Visits (Telemedicine).  Patients are able to view lab/test results, encounter notes, upcoming appointments, etc.  Non-urgent messages can be sent to your provider as well.   To learn more about what you can do with MyChart, go to [email or web address].   Other Instructions Your physician has requested that you have an echocardiogram. Echocardiography is a painless test that uses sound waves to create images of your heart. It provides your doctor with information about the size and shape of your heart and how well your heart's chambers and valves are working. This procedure takes approximately one hour. There are no restrictions for this procedure. Please do NOT wear  cologne, perfume, aftershave, or lotions (deodorant is allowed). Please arrive 15 minutes prior to your appointment time.  Please note: We ask at that you not bring children with you during ultrasound (echo/ vascular) testing. Due to room size and safety concerns, children are not allowed in the ultrasound rooms during exams. Our front office staff cannot provide observation of children in our lobby area while testing is being conducted. An adult accompanying a patient to their appointment will only be allowed in the ultrasound room at the discretion of the ultrasound technician under special circumstances. We apologize for any inconvenience.

## 2024-04-15 NOTE — Progress Notes (Signed)
 Cardiology Office Note:  .   Date:  04/15/2024  ID:  Marissa Lewis, DOB [birth year], MRN [id number] PCP:  Perri Ronal JINNY, MD  Former Cardiology Providers: None [facility] Providers Cardiologist:  Madonna Large, DO , [facility] (established care 09/18/2019) Electrophysiologist:  None  Click to update primary MD,subspecialty MD or APP then REFRESH:1}    Chief Complaint  Patient presents with   Follow-up   Mitral valve disease    History of Present Illness: .   Marissa Lewis is a 53 y.o. African-American female whose past medical history and cardiovascular risk factors includes: myxomatous mitral valve with bileaflet prolapse and mitral regurgitation s/p MV repair (40 mm ring 03/18/2024 with Dr. Ricky).  Patient has known history of myxomatous mitral valve and mitral valve regurgitation which is being followed medically.  However recently she was sent in by her PCP for worsening symptoms of shortness of breath, orthopnea, and PND.  She underwent TEE as well as heart catheterization and a shared decision was to proceed forward with mitral valve repair.  Given her young age minimal invasive approach was recommended and therefore was sent to [facility] to see Dr. Ricky.   Patient underwent mitral valve repair 40 mm ring via heartport on 03/18/2024.  Postoperative course notable for postop bleeding secondary to a large clot and underwent chest washout.  She also sustained a right implant rupture intraoperatively.  Patient was discharged on 03/23/2024.  Patient followed up with cardiothoracic surgery in clinic 04/10/2024 and presents today for follow-up.  Shortness of breath is significantly improving. Still endorses fatigue. Plans to start cardiac rehab shortly.   Review of Systems: .   Review of Systems  Constitutional: Positive for malaise/fatigue.  Cardiovascular:  Positive for dyspnea on exertion (better, improved by 50%) and paroxysmal nocturnal dyspnea. Negative for chest pain,  claudication, irregular heartbeat, leg swelling, near-syncope, orthopnea, palpitations and syncope.  Hematologic/Lymphatic: Negative for bleeding problem.    Studies Reviewed:     Echocardiogram: 03/23/2024 at [facility], Care Everywhere NORMAL LEFT VENTRICULAR SYSTOLIC FUNCTION WITH MILD LVH  ESTIMATED EF: >55%  DIASTOLIC FUNCTION CAN'T BE DETERMINED  NORMAL RIGHT VENTRICULAR SYSTOLIC FUNCTION  VALVULAR REGURGITATION: No AR, No MR, TRIVIAL PR, TRIVIAL TR                              VALVULAR STENOSIS: No AS, PROSTHETIC MV RING, No PS, No TS            S/P MV REPAIR ON 03/18/2024, MEAN GRADIENT 3 MM HG, NO MR           Left & Right Heart cath and coronary angiography 12/18/2023 Dominance: Left   Angiographically Normal Coronary Arteries with a Left Dominant System Moderate to severe MR by TEE Normal Right Heart Cath Numbers- mean PAP 22 mm, with PCWP of 19 mm. LVEDP of 15 mmHg. Fick Cardiac Output-Index 6.27-3.22.    RECOMMENDATIONS   In the absence of any other complications or medical issues, we expect the patient to be ready for discharge from a cath perspective on 12/18/2023.   Will plan referral to [facility] for evaluation for MVR  RADIOLOGY: NA  Risk Assessment/Calculations:   NA   Labs:       Latest Ref Rng & Units 12/18/2023   12:37 PM 12/18/2023   12:32 PM 12/18/2023   12:26 PM  CBC  Hemoglobin 12.0 - 15.0 g/dL 88.0  88.3  88.3  Hematocrit 36.0 - 46.0 % 35.0  34.0  34.0        Latest Ref Rng & Units 12/18/2023   12:37 PM 12/18/2023   12:32 PM 12/18/2023   12:26 PM  BMP  Sodium 135 - 145 mmol/L 140  141  142   Potassium 3.5 - 5.1 mmol/L 3.8  3.7  3.8       Latest Ref Rng & Units 12/18/2023   12:37 PM 12/18/2023   12:32 PM 12/18/2023   12:26 PM  CMP  Sodium 135 - 145 mmol/L 140  141  142   Potassium 3.5 - 5.1 mmol/L 3.8  3.7  3.8     Lab Results  Component Value Date   CHOL 198 11/23/2023   HDL 78 11/23/2023   LDLCALC 104 (H) 11/23/2023    TRIG 71 11/23/2023   CHOLHDL 2.5 11/23/2023   No results for input(s): LIPOA in the last 8760 hours. No components found for: NTPROBNP No results for input(s): PROBNP in the last 8760 hours. Recent Labs    11/23/23 0912  TSH 0.92    Physical Exam:    Today's Vitals   04/15/24 0817  BP: 120/82  Pulse: (!) 103  Resp: 16  SpO2: 97%  Weight: 171 lb (77.6 kg)  Height: 5' 9 (1.753 m)    Body mass index is 25.25 kg/m. Wt Readings from Last 3 Encounters:  04/15/24 171 lb (77.6 kg)  04/11/24 168 lb (76.2 kg)  01/07/24 173 lb 6.4 oz (78.7 kg)    Physical Exam  Constitutional: No distress.  Age appropriate, hemodynamically stable.   Neck: No JVD present.  Cardiovascular: Normal rate, regular rhythm, S1 normal, S2 normal, intact distal pulses and normal pulses. Exam reveals no gallop, no S3 and no S4.  Murmur heard. Holosystolic murmur is present with a grade of 3/6 at the apex. Pulmonary/Chest: Effort normal and breath sounds normal. No stridor. She has no wheezes. She has no rales.  Abdominal: Soft. Bowel sounds are normal. She exhibits no distension. There is no abdominal tenderness.  Musculoskeletal:        General: No edema.     Cervical back: Neck supple.  Neurological: She is alert and oriented to person, place, and time. She has intact cranial nerves (2-12).  Skin: Skin is warm and moist.     Impression & Recommendation(s):  Impression:   ICD-10-CM   1. Mitral valve disease  I05.9 ECHOCARDIOGRAM COMPLETE    2. S/P mitral valve repair  Z98.890         Recommendation(s):  Mitral valve disease S/P mitral valve repair.  MVP (mitral valve prolapse) Known history of bilateral mitral valve prolapse/myxomatous mitral valve with mitral regurgitation. S/P MV repair (40 mm ring) via heartport  on 03/18/2024 Post -echo done at [facility] - results reviewed in care everywhere  Outpt Cardiology appt from 04/10/2024 reviewed in care everywhere Cardiac rehab pending   Patient is aware of antibiotic prophylaxis prior to dental procedures. Repeat echocardiogram in 1 year status post mitral valve repair.  Follow-up office visit thereafter. Continue aspirin  81 mg p.o. daily Reemphasized importance of blood pressure management.  Orders Placed:  Orders Placed This Encounter  Procedures   ECHOCARDIOGRAM COMPLETE    Standing Status:   Future    Expected Date:   04/15/2025    Expiration Date:   07/14/2025    Where should this test be performed[facility]    Does the patient weigh less  than or greater than 250 lbs?:   Patient weighs less than 250 lbs    Perflutren DEFINITY (image enhancing agent) should be administered unless hypersensitivity or allergy exist:   Administer Perflutren    Reason for exam-Echo:   S/P Mitral Valve repair Z98.89     Final Medication List:   No orders of the defined types were placed in this encounter.   Medications Discontinued During This Encounter  Medication Reason   hydrOXYzine (ATARAX) 10 MG tablet Dose change     Current Outpatient Medications:    ascorbic acid (VITAMIN C) 500 MG tablet, Take 500 mg by mouth daily., Disp: , Rfl:    BIOTIN PO, Take 1 tablet by mouth daily., Disp: , Rfl:    Cholecalciferol (VITAMIN D3) 125 MCG (5000 UT) CAPS, Take 5,000 Units by mouth daily., Disp: , Rfl:    ferrous sulfate 325 (65 FE) MG tablet, Take 325 mg by mouth daily with breakfast., Disp: , Rfl:    hydrOXYzine (VISTARIL) 25 MG capsule, Take 1 capsule (25 mg total) by mouth every 8 (eight) hours as needed., Disp: 30 capsule, Rfl: 1   ibuprofen (ADVIL) 200 MG tablet, Take 400 mg by mouth every 6 (six) hours as needed for moderate pain., Disp: , Rfl:    Probiotic Product (PROBIOTIC DAILY PO), Take 1 capsule by mouth daily., Disp: , Rfl:   Consent:   NA  Disposition:   12 month follow up after echo OR sooner if needed  Her questions and concerns were addressed to her satisfaction. She voices understanding of the  recommendations provided during this encounter.    Signed, Madonna Michele HAS, [facility]  A Division of [facility] [address], [place], [place] [address]  04/15/2024 2:48 PM

## 2024-04-16 ENCOUNTER — Other Ambulatory Visit (HOSPITAL_COMMUNITY): Payer: Self-pay | Admitting: *Deleted

## 2024-04-16 DIAGNOSIS — Z9889 Other specified postprocedural states: Secondary | ICD-10-CM

## 2024-04-20 NOTE — Progress Notes (Signed)
 IRonal JINNY Hailstone, MD, have reviewed all documentation for this visit. The documentation on 04/11/2024 for the exam, diagnosis, procedures, and orders are all accurate and complete.

## 2024-04-23 ENCOUNTER — Telehealth (HOSPITAL_COMMUNITY): Payer: Self-pay

## 2024-04-23 NOTE — Telephone Encounter (Signed)
 Called patient to see if she was interested in participating in the Cardiac Rehab Program. Patient will come in for orientation on 12/18 and will attend the 11:45 exercise class on Mondays & Wednesdays.  Sent MyChart message.

## 2024-04-25 ENCOUNTER — Encounter: Payer: Self-pay | Admitting: Internal Medicine

## 2024-05-14 ENCOUNTER — Telehealth (HOSPITAL_COMMUNITY): Payer: Self-pay | Admitting: *Deleted

## 2024-05-14 NOTE — Telephone Encounter (Signed)
 Left message to call cardiac rehab. Called to confirm orientation appointment tomorrow.Hadassah Elpidio Quan RN BSN

## 2024-05-15 ENCOUNTER — Encounter (HOSPITAL_COMMUNITY): Admission: RE | Admit: 2024-05-15 | Source: Ambulatory Visit

## 2024-05-15 ENCOUNTER — Telehealth (HOSPITAL_COMMUNITY): Payer: Self-pay

## 2024-05-15 NOTE — Telephone Encounter (Signed)
 Pt has not arrived for her 8:00 orientation appt. Called and L/M on V/M requesting return call to our department.   Alm Parkins MS, ACSM-CEP, CCRP

## 2024-05-19 ENCOUNTER — Encounter (HOSPITAL_COMMUNITY)

## 2024-05-21 ENCOUNTER — Encounter (HOSPITAL_COMMUNITY)

## 2024-05-26 ENCOUNTER — Encounter (HOSPITAL_COMMUNITY)

## 2024-05-28 ENCOUNTER — Encounter (HOSPITAL_COMMUNITY)

## 2024-06-02 ENCOUNTER — Encounter (HOSPITAL_COMMUNITY)

## 2024-06-04 ENCOUNTER — Encounter (HOSPITAL_COMMUNITY)

## 2024-06-05 ENCOUNTER — Telehealth: Payer: Self-pay | Admitting: Cardiology

## 2024-06-05 NOTE — Telephone Encounter (Signed)
 Have her monitor the BP for the next several days and call us  back.  If needed we can add a small dose thiazide diuretic that will help with both.   Thanks   Dr. Michele

## 2024-06-05 NOTE — Telephone Encounter (Signed)
 Patient notified.  She will call readings to office

## 2024-06-05 NOTE — Telephone Encounter (Signed)
 I spoke with patient.  She reports she noticed a little swelling on the inner side of her left ankle last evening.  Describes as an indentation when she pushes in with her thumb.  Was still present this morning when she got up.  Not sure if swelling is still present as she is at work and has boots on.  No swelling on right. Weight stable. No shortness of breath. No increased salt intake. Ankle is not painful.  Checked BP four times with wrist cuff last evening and range was 138/90-145/93.  She is going to try and have BP checked at work today. Reports the only medication she currently takes is aspirin  81 mg daily. I told patient we would send message to Dr Michele and contact her with his recommendations.

## 2024-06-05 NOTE — Telephone Encounter (Signed)
 Pt c/o swelling/edema: STAT if pt has developed SOB within 24 hours  If swelling, where is the swelling located? Left ankle   How much weight have you gained and in what time span? No   Have you gained 2 pounds in a day or 5 pounds in a week? No   Do you have a log of your daily weights (if so, list)? No   Are you currently taking a fluid pill? No   Are you currently SOB? No   Have you traveled recently in a car or plane for an extended period of time? No    Pt noticed swelling in left ankles last night. Pt asking if she should see Dr. Michele since he told her to watch for fluid retention after procedure 03/18/24. Please advise

## 2024-06-09 ENCOUNTER — Encounter (HOSPITAL_COMMUNITY)

## 2024-06-11 ENCOUNTER — Encounter (HOSPITAL_COMMUNITY)

## 2024-06-16 ENCOUNTER — Encounter (HOSPITAL_COMMUNITY)

## 2024-06-18 ENCOUNTER — Encounter (HOSPITAL_COMMUNITY)

## 2024-06-23 ENCOUNTER — Encounter (HOSPITAL_COMMUNITY)

## 2024-06-25 ENCOUNTER — Encounter (HOSPITAL_COMMUNITY)

## 2024-06-30 ENCOUNTER — Encounter (HOSPITAL_COMMUNITY)

## 2024-07-02 ENCOUNTER — Encounter (HOSPITAL_COMMUNITY)

## 2024-07-07 ENCOUNTER — Encounter (HOSPITAL_COMMUNITY)

## 2024-07-09 ENCOUNTER — Encounter (HOSPITAL_COMMUNITY)

## 2024-07-14 ENCOUNTER — Encounter (HOSPITAL_COMMUNITY)

## 2024-07-16 ENCOUNTER — Encounter (HOSPITAL_COMMUNITY)

## 2024-07-21 ENCOUNTER — Other Ambulatory Visit

## 2024-07-21 ENCOUNTER — Encounter (HOSPITAL_COMMUNITY)

## 2024-07-23 ENCOUNTER — Encounter (HOSPITAL_COMMUNITY)

## 2024-07-25 ENCOUNTER — Ambulatory Visit: Admitting: Internal Medicine

## 2024-07-28 ENCOUNTER — Encounter (HOSPITAL_COMMUNITY)

## 2024-07-30 ENCOUNTER — Encounter (HOSPITAL_COMMUNITY)

## 2024-08-04 ENCOUNTER — Encounter (HOSPITAL_COMMUNITY)

## 2024-08-06 ENCOUNTER — Encounter (HOSPITAL_COMMUNITY)

## 2025-04-13 ENCOUNTER — Ambulatory Visit (HOSPITAL_COMMUNITY)
